# Patient Record
Sex: Female | Born: 1985 | Race: Black or African American | Hispanic: No | Marital: Single | Smoking: Current every day smoker
Health system: Southern US, Community
[De-identification: ages and names within clinical notes are randomized; demographics above are authoritative.]

## PROBLEM LIST (undated history)

## (undated) DIAGNOSIS — Z8619 Personal history of other infectious and parasitic diseases: Secondary | ICD-10-CM

## (undated) DIAGNOSIS — R51 Headache: Secondary | ICD-10-CM

## (undated) HISTORY — DX: Personal history of other infectious and parasitic diseases: Z86.19

---
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
## 2004-02-04 ENCOUNTER — Emergency Department (HOSPITAL_COMMUNITY): Admission: EM | Admit: 2004-02-04 | Discharge: 2004-02-04 | Payer: Self-pay

## 2004-07-05 ENCOUNTER — Emergency Department (HOSPITAL_COMMUNITY): Admission: EM | Admit: 2004-07-05 | Discharge: 2004-07-05 | Payer: Self-pay | Admitting: Emergency Medicine

## 2006-09-15 ENCOUNTER — Inpatient Hospital Stay (HOSPITAL_COMMUNITY): Admission: AD | Admit: 2006-09-15 | Discharge: 2006-09-15 | Payer: Self-pay | Admitting: Obstetrics

## 2006-09-21 ENCOUNTER — Ambulatory Visit (HOSPITAL_COMMUNITY): Admission: RE | Admit: 2006-09-21 | Discharge: 2006-09-21 | Payer: Self-pay | Admitting: Obstetrics

## 2006-09-21 ENCOUNTER — Encounter (INDEPENDENT_AMBULATORY_CARE_PROVIDER_SITE_OTHER): Payer: Self-pay | Admitting: *Deleted

## 2007-03-23 ENCOUNTER — Inpatient Hospital Stay (HOSPITAL_COMMUNITY): Admission: AD | Admit: 2007-03-23 | Discharge: 2007-03-26 | Payer: Self-pay | Admitting: Obstetrics

## 2007-03-23 ENCOUNTER — Encounter: Payer: Self-pay | Admitting: Emergency Medicine

## 2008-02-03 ENCOUNTER — Emergency Department (HOSPITAL_COMMUNITY): Admission: EM | Admit: 2008-02-03 | Discharge: 2008-02-03 | Payer: Self-pay | Admitting: Emergency Medicine

## 2008-09-08 ENCOUNTER — Emergency Department (HOSPITAL_COMMUNITY): Admission: EM | Admit: 2008-09-08 | Discharge: 2008-09-08 | Payer: Self-pay | Admitting: Emergency Medicine

## 2008-09-09 ENCOUNTER — Emergency Department (HOSPITAL_COMMUNITY): Admission: EM | Admit: 2008-09-09 | Discharge: 2008-09-09 | Payer: Self-pay | Admitting: Emergency Medicine

## 2008-10-19 ENCOUNTER — Emergency Department (HOSPITAL_COMMUNITY): Admission: EM | Admit: 2008-10-19 | Discharge: 2008-10-19 | Payer: Self-pay | Admitting: Emergency Medicine

## 2010-02-18 ENCOUNTER — Emergency Department (HOSPITAL_COMMUNITY): Admission: EM | Admit: 2010-02-18 | Discharge: 2010-02-18 | Payer: Self-pay | Admitting: Emergency Medicine

## 2010-09-24 ENCOUNTER — Emergency Department (HOSPITAL_COMMUNITY): Admission: EM | Admit: 2010-09-24 | Discharge: 2010-09-25 | Payer: Self-pay | Admitting: Emergency Medicine

## 2011-02-04 LAB — POCT I-STAT, CHEM 8
Calcium, Ion: 1.07 mmol/L — ABNORMAL LOW (ref 1.12–1.32)
Chloride: 105 mEq/L (ref 96–112)
Glucose, Bld: 101 mg/dL — ABNORMAL HIGH (ref 70–99)
HCT: 44 % (ref 36.0–46.0)

## 2011-02-04 LAB — URINALYSIS, ROUTINE W REFLEX MICROSCOPIC
Glucose, UA: NEGATIVE mg/dL
Ketones, ur: NEGATIVE mg/dL
Protein, ur: NEGATIVE mg/dL

## 2011-03-30 ENCOUNTER — Inpatient Hospital Stay (HOSPITAL_COMMUNITY): Payer: Medicaid Other

## 2011-03-30 ENCOUNTER — Inpatient Hospital Stay (HOSPITAL_COMMUNITY)
Admission: AD | Admit: 2011-03-30 | Discharge: 2011-03-30 | Disposition: A | Discharge status: Home / Self Care | Payer: Medicaid Other | Source: Ambulatory Visit | Attending: Obstetrics & Gynecology | Admitting: Obstetrics & Gynecology

## 2011-03-30 DIAGNOSIS — O2390 Unspecified genitourinary tract infection in pregnancy, unspecified trimester: Secondary | ICD-10-CM | POA: Insufficient documentation

## 2011-03-30 DIAGNOSIS — B3731 Acute candidiasis of vulva and vagina: Secondary | ICD-10-CM | POA: Insufficient documentation

## 2011-03-30 DIAGNOSIS — B373 Candidiasis of vulva and vagina: Secondary | ICD-10-CM

## 2011-03-30 DIAGNOSIS — R109 Unspecified abdominal pain: Secondary | ICD-10-CM

## 2011-03-30 LAB — URINE MICROSCOPIC-ADD ON

## 2011-03-30 LAB — WET PREP, GENITAL: Trich, Wet Prep: NONE SEEN

## 2011-03-30 LAB — URINALYSIS, ROUTINE W REFLEX MICROSCOPIC
Bilirubin Urine: NEGATIVE
Glucose, UA: NEGATIVE mg/dL
Hgb urine dipstick: NEGATIVE
Specific Gravity, Urine: 1.01 (ref 1.005–1.030)
Urobilinogen, UA: 1 mg/dL (ref 0.0–1.0)
pH: 6 (ref 5.0–8.0)

## 2011-03-30 LAB — ABO/RH: ABO/RH(D): O POS

## 2011-03-30 LAB — CBC
Hemoglobin: 13 g/dL (ref 12.0–15.0)
RBC: 4.56 MIL/uL (ref 3.87–5.11)
WBC: 9.7 10*3/uL (ref 4.0–10.5)

## 2011-04-11 NOTE — Op Note (Signed)
NAME:  [name], [name]                       ACCOUNT NO.:  [id number]   MEDICAL RECORD NO.:  [id number]                   PATIENT TYPE:  INP   LOCATION:  9103                                 FACILITY:  [facility]   PHYSICIAN:  Georgina Peer, M.D.              DATE OF BIRTH:  February 14, 1986   DATE OF PROCEDURE:  09/03/2003  DATE OF DISCHARGE:                                 OPERATIVE REPORT   PREOPERATIVE DIAGNOSIS:  Pregnancy at 43 and 3/7 weeks, active phase arrest.   POSTOPERATIVE DIAGNOSIS:  1. Pregnancy at 41 and 3/7 weeks, active phase arrest.  2. Chorioamnionitis.  3. Persistent occiput posterior.   PROCEDURE:  Low cervical transverse cesarean section.   SURGEON:  Georgina Peer, M.D.   ANESTHESIA:  Epidural by Dr. Quillian Quince.   ESTIMATED BLOOD LOSS:  750 mL.   FLUIDS:  1500 mL.   URINE OUTPUT:  220 mL, clear.   FINDINGS:  7 pound 5 ounce female at 14:07, foul odor to amniotic fluid,  moderate meconium stained fluid, normal tubes and ovaries.   INDICATIONS FOR PROCEDURE:  This is an 25 year old primigravida with an EDC  of August 25, 2003 who presented in active labor.  She progressed to 3 cm,  fully effaced and then to 4 cm with an edematous cervix at 10:00.  An  epidural was given and the patient was comfortable.  Pitocin augmentation  was given.  The patient still remained 4 cm and edematous.  She was taken  for a cesarean section for active phase arrest.  Shortly before the cesarean  section, the patient's temperature was noted to be 101.2.  There was no  fetal tachycardia.  There was no cough, shortness of breath or dysuria, but  a urine was sent for culture.   DESCRIPTION OF PROCEDURE:  The patient was taken to the operating room and  an epidural was dosed appropriately.  She was prepped and draped in normal  sterile fashion.  After adequate anesthesia and a reassuring fetal heart  rate tracing, a transverse skin incision was made.  The abdomen was opened  in a normal Pfannenstiel manner and a transverse bladder flap was created.  A transverse uterine incision was made.  The patient's amniotic fluid was  noted to be meconium stained and there was a foul odor to the amniotic  fluid.  The infant was in the occiput posterior presentation.  The incision  was widened.  The infant was rotated anteriorly and the head was delivered  with DeLee suction of the mouth and nose and bulb suctioning was  accomplished.  The infant was a female delivered at 14:07 with Apgars of 9 and  9 and a weight of 7 pounds 5 ounces.  Cultures anaerobic and aerobic were  taken from the placenta and the placenta was sent for pathology.  Cord blood  was taken.  The uterus contracted well.  She  received two grams of Mefoxin.  The uterine incision was closed in one layer of running locked Vicryl with  an imbricating partial layer of 0 Vicryl from the left side which was  bleeding.  There was copious irrigation of fluid and all blood and debris  were removed.  The tubes and ovaries were inspected and found to be normal.  The bladder flap was reapproximated over the incision.  All bleeders were  cauterized.  The fascia was closed with Vicryl suture.  The subcutaneous  tissue was cauterized for any bleeders.  Irrigation was accomplished.  The  skin was closed with skin staples.  The patient returned to the recovery  area in stable condition. Sponge, needle and instrument counts were correct.                                               Georgina Peer, M.D.    JPN/MEDQ  D:  09/03/2003  T:  09/03/2003  Job:  [id number]

## 2011-04-11 NOTE — Discharge Summary (Signed)
   NAME:  [name], [name]                       ACCOUNT NO.:  [id number]   MEDICAL RECORD NO.:  [id number]                   PATIENT TYPE:  INP   LOCATION:  9103                                 [facility]   PHYSICIAN:  Georgina Peer, M.D.              DATE OF BIRTH:  [birth year]   DATE OF ADMISSION:  09/03/2003  DATE OF DISCHARGE:                                 DISCHARGE SUMMARY   ADMISSION DIAGNOSIS:  Forty-one-week pregnancy in labor.   DISCHARGE DIAGNOSES:  1. Forty-one-week pregnancy.  2. Arrest of active phase.  3. Chorioamnionitis.  4. Compensated anemia.   The patient is an 25 year old primigravida with [facility] August 25, 2003 who  presented September 03, 2003 with labor.  Progressed to 4 cm and the cervix  became edematous.  The patient had a fever of 101 prior to cesarean section.  She was taken to cesarean section.  A baby persistent occiput posterior with  foul-smelling amniotic fluid was delivered.  The infant weighed 7 pounds 5  ounces, Apgars 9 and 9.  She was placed on gentamycin and clindamycin for 48  hours.  She remained afebrile.  She was ambulating, eating, and voiding,  passed gas.  Her WBC was on admission elevated to 19,800.  It peaked at  23,600 and then decreased to 18,600 on October 13.  Hemoglobin was 8.1 with  nadir at 7.8 on October 12.  She was on iron.  Her CMET was normal.  Her  staples were removed.  Her urine culture was no growth.  Her wound culture  showed gram positive cocci but no growth in two days on culture, and her  anaerobic cultures are negative so far but will be incubated for a full five  days.  She was given discharge instructions, prescription for Percocet 5/325  #50, Motrin 600 #60; she has iron over-the-counter and prenatal vitamins.  She was given a discharge booklet and she will follow up with Dr. Coral Ceo at [facility] in four to six weeks.                                               Georgina Peer,  M.D.    JPN/MEDQ  D:  09/06/2003  T:  09/06/2003  Job:  [id number]   cc:   Leonette Most A. Clearance Coots, M.D.  [address]  [place]  [place] [address]  Fax: [phone]

## 2011-04-11 NOTE — Discharge Summary (Signed)
NAMEJAZLIN, [name] NO.:  [id number]   MEDICAL RECORD NO.:  [id number]          PATIENT TYPE:  INP   LOCATION:  9317                          FACILITY:  [facility]   PHYSICIAN:  Roseanna Rainbow, M.D.DATE OF BIRTH:  1986/05/30   DATE OF ADMISSION:  03/23/2007  DATE OF DISCHARGE:  03/26/2007                               DISCHARGE SUMMARY   CHIEF COMPLAINT:  The patient is a 25 year old, G1, P1 with last  menstrual period April 24, complaining of abdominal pain.   HISTORY OF PRESENT ILLNESS:  Please see the above.  The patient  presented to the emergency department with the above complaints.  A CT  scan was negative.  Her white blood cell count was 27,000.  She was  transferred to [facility] with a likely diagnosis of pelvic  inflammatory disease.   ALLERGIES:  PENICILLIN.   PAST SURGICAL HISTORY:  Cesarean delivery.   PHYSICAL EXAMINATION:  VITAL SIGNS:  Stable, afebrile.  GENERAL:  Well-developed, well-nourished in no apparent distress.  HEENT:  Normocephalic, atraumatic.  NECK:  Supple.  LUNGS: Clear to auscultation bilaterally.  HEART:  Regular rate and rhythm.  ABDOMEN:  Bilateral lower quadrant tenderness.  PELVIC:  Bimanual exam with cervical motion tenderness.  The uterus is  tender as well.   ASSESSMENT:  Pelvic inflammatory disease.   PLAN:  Admission for broad-spectrum parenteral antibiotics.   HOSPITAL COURSE:  The patient was admitted.  Her symptoms improved.  Her  GC DNA probe was positive.  HIV was nonreactive.  Her RPR was  nonreactive as well.  She was then discharged to home on May 2.  The  patient received Depo-Provera for contraception prior to discharge.   DISCHARGE DIAGNOSIS:  Pelvic inflammatory disease.   CONDITION ON DISCHARGE:  Stable.   DIET:  Regular.   ACTIVITY:  Pelvic rest.   DISCHARGE MEDICATIONS:  1. Doxycycline.  2. Flagyl.   DISPOSITION:  The patient was to follow up in the office in 2 weeks.      Roseanna Rainbow, M.D.  Electronically Signed     LAJ/MEDQ  D:  04/30/2007  T:  04/30/2007  Job:  [id number]

## 2011-04-11 NOTE — Op Note (Signed)
[name], [name]             ACCOUNT NO.:  [id number]   MEDICAL RECORD NO.:  [id number]          PATIENT TYPE:  AMB   LOCATION:  SDC                           [facility]   PHYSICIAN:  Charles A. Clearance Coots, M.D.DATE OF BIRTH:  [birth year]   DATE OF PROCEDURE:  09/21/2006  DATE OF DISCHARGE:  09/21/2006                               OPERATIVE REPORT   PREOP DIAGNOSIS:  Missed abortion, first trimester.   POSTOP DIAGNOSIS:  Missed abortion, first trimester.   PROCEDURE:  Suction, dilation, and evacuation.   SURGEON:  Coral Ceo   ANESTHESIA:  MAC with paracervical block.   ESTIMATED BLOOD LOSS:  100 mL.   COMPLICATIONS:  None.   SPECIMEN:  Products of conception to pathology.   OPERATION:  The patient was brought to the operating room, and after  satisfactory IV sedation, the legs were brought up in stirrups, and the  vagina was prepped and draped in the usual sterile fashion.  The urinary  bladder was emptied of approximately 50 mL of clear urine.  Bimanual  examination revealed the uterus to be enlarged and mid position.  A  sterile speculum was inserted in the vaginal vault, and the cervix was  isolated.  The anterior lip of the cervix was grasped with a single-  tooth tenaculum.  Paracervical block of approximately 20 mL of 1%  Xylocaine, approximately 10 mL was injected in each lateral fornix.  The  axis of the uterus was then sounded, and the cervix was dilated to a No.  25 Pratt dilator.  A No. 8 suction cath was then easily introduced into  the uterine cavity, and all contents were evacuated.  The endometrial  surface was gritty at the conclusion of the procedure, and the uterus  contracted down quite well.  There was no active bleeding.  At the  conclusion of the procedure, all instruments were retired.  The patient  tolerated procedure well.  She was transported to the recovery room in  satisfactory condition.      Charles A. Clearance Coots, M.D.  Electronically Signed     CAH/MEDQ  D:  10/17/2006  T:  10/17/2006  Job:  [id number]

## 2011-05-11 ENCOUNTER — Emergency Department (HOSPITAL_COMMUNITY): Payer: Medicaid Other

## 2011-05-11 ENCOUNTER — Emergency Department (HOSPITAL_COMMUNITY)
Admission: EM | Admit: 2011-05-11 | Discharge: 2011-05-11 | Disposition: A | Discharge status: Home / Self Care | Payer: Medicaid Other | Attending: Emergency Medicine | Admitting: Emergency Medicine

## 2011-05-11 DIAGNOSIS — R296 Repeated falls: Secondary | ICD-10-CM | POA: Insufficient documentation

## 2011-05-11 DIAGNOSIS — S6390XA Sprain of unspecified part of unspecified wrist and hand, initial encounter: Secondary | ICD-10-CM | POA: Insufficient documentation

## 2011-05-11 DIAGNOSIS — Y9302 Activity, running: Secondary | ICD-10-CM | POA: Insufficient documentation

## 2011-05-11 DIAGNOSIS — M79609 Pain in unspecified limb: Secondary | ICD-10-CM | POA: Insufficient documentation

## 2011-05-31 LAB — RUBELLA ANTIBODY, IGM: Rubella: IMMUNE

## 2011-05-31 LAB — HEPATITIS B SURFACE ANTIGEN: Hepatitis B Surface Ag: NEGATIVE

## 2011-06-05 LAB — ABO/RH: RH Type: POSITIVE

## 2011-06-05 LAB — ANTIBODY SCREEN: Antibody Screen: NEGATIVE

## 2011-08-18 LAB — STREP A DNA PROBE: Group A Strep Probe: NEGATIVE

## 2011-08-25 LAB — URINE MICROSCOPIC-ADD ON

## 2011-08-25 LAB — URINALYSIS, ROUTINE W REFLEX MICROSCOPIC
Nitrite: POSITIVE — AB
Protein, ur: 100 — AB
Specific Gravity, Urine: 1.011
Urobilinogen, UA: 1

## 2011-08-25 LAB — POCT I-STAT, CHEM 8
BUN: 8
Creatinine, Ser: 1.2
Glucose, Bld: 96
Hemoglobin: 13.9
Potassium: 3.2 — ABNORMAL LOW
TCO2: 24

## 2011-08-25 LAB — URINE CULTURE: Colony Count: >=100000

## 2011-08-29 ENCOUNTER — Encounter (HOSPITAL_COMMUNITY): Payer: Self-pay | Admitting: *Deleted

## 2011-09-08 ENCOUNTER — Inpatient Hospital Stay (HOSPITAL_COMMUNITY)
Admission: AD | Admit: 2011-09-08 | Discharge: 2011-09-08 | Disposition: A | Discharge status: Home / Self Care | Payer: Medicaid Other | Source: Ambulatory Visit | Attending: Obstetrics | Admitting: Obstetrics

## 2011-09-08 ENCOUNTER — Encounter (HOSPITAL_COMMUNITY): Payer: Self-pay | Admitting: *Deleted

## 2011-09-08 DIAGNOSIS — O219 Vomiting of pregnancy, unspecified: Secondary | ICD-10-CM | POA: Diagnosis present

## 2011-09-08 DIAGNOSIS — O212 Late vomiting of pregnancy: Secondary | ICD-10-CM | POA: Insufficient documentation

## 2011-09-08 HISTORY — DX: Headache: R51

## 2011-09-08 LAB — URINALYSIS, ROUTINE W REFLEX MICROSCOPIC
Bilirubin Urine: NEGATIVE
Hgb urine dipstick: NEGATIVE
Ketones, ur: NEGATIVE mg/dL
Nitrite: NEGATIVE
Protein, ur: NEGATIVE mg/dL
Specific Gravity, Urine: 1.01 (ref 1.005–1.030)
Urobilinogen, UA: 0.2 mg/dL (ref 0.0–1.0)

## 2011-09-08 MED ORDER — ONDANSETRON HCL 4 MG PO TABS: 4.0000 mg | ORAL_TABLET | Freq: Every day | ORAL | Status: DC | PRN

## 2011-09-08 MED ORDER — ACETAMINOPHEN 325 MG PO TABS
650.0000 mg | ORAL_TABLET | Freq: Once | ORAL | Status: AC
  Administered 2011-09-08: 650 mg via ORAL
  Filled 2011-09-08: qty 2

## 2011-09-08 MED ORDER — GI COCKTAIL ~~LOC~~
30.0000 mL | Freq: Once | ORAL | Status: AC
  Administered 2011-09-08: 30 mL via ORAL
  Filled 2011-09-08: qty 30

## 2011-09-08 NOTE — Progress Notes (Signed)
Pt states she started having nausea and vomiting last night. Has had a sore throat and her nose is burning. No leaking, bleeding or contractions and reports good fetal movement.

## 2011-09-08 NOTE — ED Provider Notes (Signed)
History     Chief Complaint  Patient presents with  . Emesis   HPI G2P1001 at [redacted]w[redacted]d nausea and vomiting starting around 1 AM, last episode of vomiting at 6 AM, not nauseous now. C/O sore throat and nose starting after vomiting started. No fever, chills, constipation, diarrhea, no recent exposure to illness. + fetal movement, no contractions, LOF, or vaginal bleeding.  OB History    Grav Para Term Preterm Abortions TAB SAB Ect Mult Living   2 1 1       1       Past Medical History  Diagnosis Date  . Headache     Past Surgical History  Procedure Date  . Cesarean section     No family history on file.  History  Substance Use Topics  . Smoking status: Former Games developer  . Smokeless tobacco: Not on file  . Alcohol Use: No    Allergies:  Allergies  Allergen Reactions  . Penicillins     Childhood reaction    Prescriptions prior to admission  Medication Sig Dispense Refill  . prenatal vitamin w/FE, FA (PRENATAL 1 + 1) 27-1 MG TABS Take 1 tablet by mouth daily.          Review of Systems  Constitutional: Negative.   HENT: Positive for sore throat.   Respiratory: Negative.   Cardiovascular: Negative.   Gastrointestinal: Negative.   Genitourinary: Negative.   Musculoskeletal: Negative.    Physical Exam   Blood pressure 115/62, pulse 79, temperature 98.7 F (37.1 C), temperature source Oral, resp. rate 18, height 5' 6.5" (1.689 m), weight 92.171 kg (203 lb 3.2 oz), SpO2 97.00%.  Physical Exam  Constitutional: She is oriented to person, place, and time. She appears well-developed and well-nourished. No distress.  HENT:  Head: Macrocephalic.  Mouth/Throat: Uvula is midline, oropharynx is clear and moist and mucous membranes are normal. No oropharyngeal exudate, posterior oropharyngeal edema or posterior oropharyngeal erythema.       Bilateral nasal erythema  GI: Soft. There is no tenderness.  Musculoskeletal: Normal range of motion.  Neurological: She is alert and  oriented to person, place, and time.  Skin: Skin is warm and dry.  Psychiatric: She has a normal mood and affect.   EFM: 10x10 accels present, BL 140s, no decels, no contractions MAU Course  Procedures  Results for orders placed during the hospital encounter of 09/08/11 (from the past 24 hour(s))  URINALYSIS, ROUTINE W REFLEX MICROSCOPIC     Status: Normal   Collection Time   09/08/11  1:50 PM      Component Value Range   Color, Urine YELLOW  YELLOW    Appearance CLEAR  CLEAR    Specific Gravity, Urine 1.010  1.005 - 1.030    pH 6.0  5.0 - 8.0    Glucose, UA NEGATIVE  NEGATIVE (mg/dL)   Hgb urine dipstick NEGATIVE  NEGATIVE    Bilirubin Urine NEGATIVE  NEGATIVE    Ketones, ur NEGATIVE  NEGATIVE (mg/dL)   Protein, ur NEGATIVE  NEGATIVE (mg/dL)   Urobilinogen, UA 0.2  0.0 - 1.0 (mg/dL)   Nitrite NEGATIVE  NEGATIVE    Leukocytes, UA NEGATIVE  NEGATIVE    GI cocktail and tylenol for pain - throat pain improved, nose still "feels weird"  Assessment and Plan  Nausea and vomiting in pregnancy, nasal and throat irritation likely d/t vomiting Rx Zofran, may use saline nasal spray and tylenol F/U on 10/17 for regular OB visit  Mertha Clyatt 09/08/2011, 2:27 PM

## 2011-09-26 ENCOUNTER — Inpatient Hospital Stay (HOSPITAL_COMMUNITY)
Admission: AD | Admit: 2011-09-26 | Discharge: 2011-09-26 | Disposition: A | Discharge status: Home / Self Care | Payer: Medicaid Other | Source: Ambulatory Visit | Attending: Obstetrics | Admitting: Obstetrics

## 2011-09-26 ENCOUNTER — Encounter (HOSPITAL_COMMUNITY): Payer: Self-pay | Admitting: *Deleted

## 2011-09-26 DIAGNOSIS — N949 Unspecified condition associated with female genital organs and menstrual cycle: Secondary | ICD-10-CM

## 2011-09-26 DIAGNOSIS — O99891 Other specified diseases and conditions complicating pregnancy: Secondary | ICD-10-CM | POA: Insufficient documentation

## 2011-09-26 DIAGNOSIS — R109 Unspecified abdominal pain: Secondary | ICD-10-CM | POA: Insufficient documentation

## 2011-09-26 LAB — URINALYSIS, ROUTINE W REFLEX MICROSCOPIC
Bilirubin Urine: NEGATIVE
Glucose, UA: NEGATIVE mg/dL
Hgb urine dipstick: NEGATIVE
Ketones, ur: 15 mg/dL — AB
Protein, ur: 30 mg/dL — AB
Urobilinogen, UA: 1 mg/dL (ref 0.0–1.0)

## 2011-09-26 LAB — URINE MICROSCOPIC-ADD ON

## 2011-09-26 MED ORDER — HYDROCODONE-ACETAMINOPHEN 7.5-500 MG PO TABS: 1.0000 | ORAL_TABLET | Freq: Four times a day (QID) | ORAL | Status: AC | PRN

## 2011-09-26 NOTE — Progress Notes (Signed)
Lower abd pain since 10/31, pain is becoming worse.  Also has thigh pain, pain is worse with movement & changing positions.  Pt denies uc's, bleeding, or LOF.

## 2011-09-26 NOTE — Progress Notes (Signed)
Pt states she has been trying to get in touch with Dr. Gaynell Face for 2 days but unsuccessful.

## 2011-09-26 NOTE — ED Provider Notes (Signed)
History     Chief Complaint  Patient presents with  . Abdominal Pain   HPI 25 y.o. BF presents with c/o Lower abdominal pain with pain in groin. Hurts more when walks or moves legs. Hurts to sleep. No contractions or bleeding or leaking.   Past Medical History  Diagnosis Date  . Headache     Past Surgical History  Procedure Date  . Cesarean section     History reviewed. No pertinent family history.  History  Substance Use Topics  . Smoking status: Former Games developer  . Smokeless tobacco: Not on file  . Alcohol Use: No    Allergies:  Allergies  Allergen Reactions  . Penicillins     Childhood reaction    Prescriptions prior to admission  Medication Sig Dispense Refill  . prenatal vitamin w/FE, FA (PRENATAL 1 + 1) 27-1 MG TABS Take 1 tablet by mouth daily.          ROS See above Physical Exam   Blood pressure 119/62, pulse 104, temperature 98.4 F (36.9 C), temperature source Oral, resp. rate 20, height 5\' 6"  (1.676 m), weight 206 lb 12.8 oz (93.804 kg).  Physical Exam  Constitutional: She is oriented to person, place, and time. She appears well-developed and well-nourished.  HENT:  Head: Normocephalic.  Respiratory: Effort normal.  GI: Soft. She exhibits no distension and no mass. There is no tenderness. There is no rebound and no guarding.       FHR reassuring. No contractions  Genitourinary: Vagina normal and uterus normal. No vaginal discharge found.       Cervix long and closed. No CMT. Does have point tenderness over SP joint.  Musculoskeletal: Normal range of motion.  Neurological: She is alert and oriented to person, place, and time.  Skin: Skin is warm and dry.  Psychiatric: She has a normal mood and affect.    MAU Course  Procedures   Assessment and Plan  A:  Symphysis pubis dysfunction      No evidence of PTL  P:  Discussed with Dr Gaynell Face      Recommend pregnancy support belt      Will give 6 tabs of Vicodin      Work note for today only    [facility] 09/26/2011, 2:55 PM

## 2011-09-28 ENCOUNTER — Inpatient Hospital Stay (HOSPITAL_COMMUNITY)
Admission: AD | Admit: 2011-09-28 | Discharge: 2011-09-29 | Disposition: A | Discharge status: Home / Self Care | Payer: Medicaid Other | Source: Ambulatory Visit | Attending: Obstetrics | Admitting: Obstetrics

## 2011-09-28 DIAGNOSIS — Z3480 Encounter for supervision of other normal pregnancy, unspecified trimester: Secondary | ICD-10-CM

## 2011-09-28 DIAGNOSIS — J111 Influenza due to unidentified influenza virus with other respiratory manifestations: Secondary | ICD-10-CM | POA: Insufficient documentation

## 2011-09-28 DIAGNOSIS — O99891 Other specified diseases and conditions complicating pregnancy: Secondary | ICD-10-CM | POA: Insufficient documentation

## 2011-09-28 DIAGNOSIS — R6889 Other general symptoms and signs: Secondary | ICD-10-CM

## 2011-09-28 NOTE — Progress Notes (Signed)
Pt presents to [facility] for c/o sore throat, fever and chills, vomiting.  Took some tylenol cold and sinus at home.

## 2011-09-28 NOTE — Progress Notes (Signed)
M. Williams, CNM at bedside.  Assessment done and poc discussed with pt.   

## 2011-09-28 NOTE — Progress Notes (Signed)
Rapid strep and flu swab collected per cnm.

## 2011-09-28 NOTE — Progress Notes (Signed)
Pt G4 P1 at 32wks, reports sore throat, body aches, congestion, cough, and vomiting since 11/3.

## 2011-09-28 NOTE — Progress Notes (Signed)
Water provided at pt request.

## 2011-09-28 NOTE — Progress Notes (Signed)
Artelia Laroche, CNM viewed strip.  Ok to dc efm while waiting on lab results.

## 2011-09-29 LAB — RAPID STREP SCREEN (MED CTR MEBANE ONLY): Streptococcus, Group A Screen (Direct): NEGATIVE

## 2011-09-29 LAB — INFLUENZA PANEL BY PCR (TYPE A & B)
H1N1 flu by pcr: NOT DETECTED
Influenza A By PCR: NEGATIVE
Influenza B By PCR: NEGATIVE

## 2011-09-29 MED ORDER — OSELTAMIVIR PHOSPHATE 75 MG PO CAPS: 75.0000 mg | ORAL_CAPSULE | Freq: Two times a day (BID) | ORAL | Status: AC

## 2011-09-29 MED ORDER — PROMETHAZINE HCL 25 MG PO TABS: 25.0000 mg | ORAL_TABLET | Freq: Four times a day (QID) | ORAL | Status: AC | PRN

## 2011-09-29 NOTE — ED Provider Notes (Signed)
History     Chief Complaint  Patient presents with  . Generalized Body Aches  . Sore Throat  . Emesis   HPI 25 y.o. G4 P1021 at [redacted]w[redacted]d presents with c/o fever, chills, sore throat and malaise today. Started with sore throat yesterday and had one episode of vomiting today. No dyspnea. Came in to see if baby was ok.    Past Medical History  Diagnosis Date  . Headache     Past Surgical History  Procedure Date  . Cesarean section     No family history on file.  History  Substance Use Topics  . Smoking status: Former Games developer  . Smokeless tobacco: Not on file  . Alcohol Use: No    Allergies:  Allergies  Allergen Reactions  . Penicillins Other (See Comments)    Childhood allergy; reaction unknown    Prescriptions prior to admission  Medication Sig Dispense Refill  . Acetaminophen (TYLENOL) 167 MG/5ML LIQD Take 10 mLs by mouth daily as needed. For sore throat       . HYDROcodone-acetaminophen (LORTAB) 7.5-500 MG per tablet Take 1 tablet by mouth every 6 (six) hours as needed for pain.  6 tablet  0  . prenatal vitamin w/FE, FA (PRENATAL 1 + 1) 27-1 MG TABS Take 1 tablet by mouth daily.          Review of Systems  Constitutional: Positive for fever, chills and malaise/fatigue.  HENT: Positive for sore throat.   Gastrointestinal: Positive for nausea and vomiting.  Neurological: Positive for weakness.   Physical Exam   Blood pressure 130/64, pulse 102, temperature 101 F (38.3 C), temperature source Oral, resp. rate 20, height 5\' 6"  (1.676 m), weight 208 lb 3.2 oz (94.439 kg).  Physical Exam  Constitutional: She is oriented to person, place, and time. She appears well-developed and well-nourished. No distress.  HENT:  Head: Normocephalic.  Mouth/Throat: Oropharynx is clear and moist. No oropharyngeal exudate.  Eyes: Right eye exhibits no discharge.  Neck: Normal range of motion.  Cardiovascular: Normal rate, regular rhythm and normal heart sounds.   Respiratory:  Effort normal and breath sounds normal. No stridor. No respiratory distress. She has no wheezes. She has no rales. She exhibits no tenderness.  GI: Soft. She exhibits no distension. There is no tenderness. There is no rebound and no guarding.  Musculoskeletal: Normal range of motion.  Neurological: She is alert and oriented to person, place, and time.  Skin: Skin is warm and dry. She is not diaphoretic.  Psychiatric: She has a normal mood and affect.   FHR reactive with no contractions Rapid Strep Negative MAU Course  Procedures   Assessment and Plan  A:  Influenza-like illness      Pregnancy, reassuring fetal status  P:  Consulted Dr Tamela Oddi      Rapid Flu pending      Will treat presumptively for flu with Tamiflu per Dr Georgeanna Lea diet, supportive care   [facility] 09/29/2011, 12:10 AM

## 2011-10-20 LAB — STREP B DNA PROBE: GBS: POSITIVE

## 2011-11-11 ENCOUNTER — Telehealth (HOSPITAL_COMMUNITY): Payer: Self-pay | Admitting: *Deleted

## 2011-11-11 ENCOUNTER — Encounter (HOSPITAL_COMMUNITY): Payer: Self-pay | Admitting: *Deleted

## 2011-11-11 NOTE — Telephone Encounter (Signed)
Preadmission screen  

## 2011-11-20 ENCOUNTER — Inpatient Hospital Stay (HOSPITAL_COMMUNITY)
Admission: AD | Admit: 2011-11-20 | Discharge: 2011-11-20 | Disposition: A | Discharge status: Home / Self Care | Payer: Medicaid Other | Source: Ambulatory Visit | Attending: Obstetrics | Admitting: Obstetrics

## 2011-11-20 ENCOUNTER — Encounter (HOSPITAL_COMMUNITY): Payer: Self-pay

## 2011-11-20 DIAGNOSIS — Z3689 Encounter for other specified antenatal screening: Secondary | ICD-10-CM

## 2011-11-20 DIAGNOSIS — Z36 Encounter for antenatal screening of mother: Secondary | ICD-10-CM

## 2011-11-20 DIAGNOSIS — O99891 Other specified diseases and conditions complicating pregnancy: Secondary | ICD-10-CM | POA: Insufficient documentation

## 2011-11-20 NOTE — Progress Notes (Signed)
Patient states she just found out her niece's baby had died and wants to make sure her baby is OK. Reports good fetal movement, no bleeding or leaking and no contractions. Is just concerned.

## 2011-11-20 NOTE — ED Provider Notes (Signed)
History     No chief complaint on file.  HPI  Pt was here when niece learned of an IUFD, wants to make sure baby is okay.  Denies any problems.    Past Medical History  Diagnosis Date  . Headache   . History of chlamydia     Past Surgical History  Procedure Date  . Cesarean section     Family History  Problem Relation Age of Onset  . Hypertension Mother   . Alcohol abuse Mother   . Diabetes Father   . Hypertension Father   . Alcohol abuse Father     History  Substance Use Topics  . Smoking status: Former Games developer  . Smokeless tobacco: Not on file  . Alcohol Use: No    Allergies:  Allergies  Allergen Reactions  . Penicillins Other (See Comments)    Childhood allergy; reaction unknown    Prescriptions prior to admission  Medication Sig Dispense Refill  . Prenatal Vit-Fe Fumarate-FA (PRENATAL MULTIVITAMIN) TABS Take 1 tablet by mouth daily.          ROS Physical Exam   Blood pressure 158/72, pulse 119, temperature 99.5 F (37.5 C), temperature source Oral, resp. rate 20, height 5\' 5"  (1.651 m), weight 96.888 kg (213 lb 9.6 oz), SpO2 97.00%. Repeat blood pressure 112/61 Pulse 83  Physical Exam  Constitutional: She is oriented to person, place, and time. She appears well-developed and well-nourished.  HENT:  Head: Normocephalic.  Neck: Normal range of motion. Neck supple.  Cardiovascular: Normal rate, regular rhythm and normal heart sounds.   Respiratory: Effort normal and breath sounds normal.  Genitourinary: No bleeding around the vagina.  Neurological: She is alert and oriented to person, place, and time.  Skin: Skin is warm and dry.    MAU Course  Procedures  FHR 120's, +accels Toco - irregular Consult with Dr. Gaynell Face > ok with discharge home with follow-up on 11/24/11  Assessment and Plan  Reactive NST  Plan: DC to home Follow-up on Monday  [facility] 11/20/2011, 1:53 PM

## 2011-11-21 ENCOUNTER — Inpatient Hospital Stay (HOSPITAL_COMMUNITY)
Admission: AD | Admit: 2011-11-21 | Discharge: 2011-11-21 | Disposition: A | Discharge status: Home / Self Care | Payer: Medicaid Other | Source: Ambulatory Visit | Attending: Obstetrics | Admitting: Obstetrics

## 2011-11-21 ENCOUNTER — Encounter (HOSPITAL_COMMUNITY): Payer: Self-pay

## 2011-11-21 DIAGNOSIS — O479 False labor, unspecified: Secondary | ICD-10-CM | POA: Insufficient documentation

## 2011-11-21 NOTE — Progress Notes (Signed)
Patient reports intermittent back pain that wraps around her abdomen since 12noon today. Doesn't know how frequent it is. Denies vaginal bleeding or leaking of fluid. Reports positive fetal movement. States Dr. Gaynell Face told her she would be induced on Monday.

## 2011-11-26 ENCOUNTER — Encounter (HOSPITAL_COMMUNITY): Payer: Self-pay

## 2011-11-26 ENCOUNTER — Inpatient Hospital Stay (HOSPITAL_COMMUNITY)
Admission: RE | Admit: 2011-11-26 | Discharge: 2011-11-29 | DRG: 766 | Disposition: A | Discharge status: Home / Self Care | Payer: Medicaid Other | Source: Ambulatory Visit | Attending: Obstetrics | Admitting: Obstetrics

## 2011-11-26 VITALS — BP 110/66 | HR 93 | Temp 98.6°F | Resp 18 | Ht 66.0 in | Wt 223.0 lb

## 2011-11-26 DIAGNOSIS — Z22330 Carrier of Group B streptococcus: Secondary | ICD-10-CM

## 2011-11-26 DIAGNOSIS — O219 Vomiting of pregnancy, unspecified: Secondary | ICD-10-CM

## 2011-11-26 DIAGNOSIS — O99892 Other specified diseases and conditions complicating childbirth: Secondary | ICD-10-CM | POA: Diagnosis present

## 2011-11-26 DIAGNOSIS — O34219 Maternal care for unspecified type scar from previous cesarean delivery: Principal | ICD-10-CM | POA: Diagnosis present

## 2011-11-26 LAB — CBC
MCH: 27.4 pg (ref 26.0–34.0)
MCV: 83.4 fL (ref 78.0–100.0)
Platelets: 233 10*3/uL (ref 150–400)
RDW: 15.2 % (ref 11.5–15.5)
WBC: 11.7 10*3/uL — ABNORMAL HIGH (ref 4.0–10.5)

## 2011-11-26 MED ORDER — CLINDAMYCIN PHOSPHATE 900 MG/50ML IV SOLN
900.0000 mg | Freq: Three times a day (TID) | INTRAVENOUS | Status: DC
  Administered 2011-11-26: 900 mg via INTRAVENOUS
  Filled 2011-11-26 (×3): qty 50

## 2011-11-26 MED ORDER — OXYTOCIN 20 UNITS IN LACTATED RINGERS INFUSION - SIMPLE: 125.0000 mL/h | Freq: Once | INTRAVENOUS | Status: DC

## 2011-11-26 MED ORDER — ACETAMINOPHEN 325 MG PO TABS
650.0000 mg | ORAL_TABLET | ORAL | Status: DC | PRN
  Administered 2011-11-27: 650 mg via ORAL
  Filled 2011-11-26: qty 2

## 2011-11-26 MED ORDER — DIPHENHYDRAMINE HCL 50 MG/ML IJ SOLN
12.5000 mg | INTRAMUSCULAR | Status: DC | PRN
Start: 2011-11-26 — End: 2011-11-27

## 2011-11-26 MED ORDER — TERBUTALINE SULFATE 1 MG/ML IJ SOLN: 0.2500 mg | Freq: Once | INTRAMUSCULAR | Status: AC | PRN

## 2011-11-26 MED ORDER — BUTORPHANOL TARTRATE 2 MG/ML IJ SOLN
1.0000 mg | INTRAMUSCULAR | Status: DC | PRN
  Administered 2011-11-26 (×3): 1 mg via INTRAVENOUS
  Filled 2011-11-26 (×3): qty 1

## 2011-11-26 MED ORDER — LACTATED RINGERS IV SOLN: 500.0000 mL | Freq: Once | INTRAVENOUS | Status: DC

## 2011-11-26 MED ORDER — FENTANYL 2.5 MCG/ML BUPIVACAINE 1/10 % EPIDURAL INFUSION (WH - ANES)
14.0000 mL/h | INTRAMUSCULAR | Status: DC
  Administered 2011-11-27: 14 mL/h via EPIDURAL
  Filled 2011-11-26 (×2): qty 60

## 2011-11-26 MED ORDER — EPHEDRINE 5 MG/ML INJ: 10.0000 mg | INTRAVENOUS | Status: DC | PRN

## 2011-11-26 MED ORDER — OXYTOCIN BOLUS FROM INFUSION
500.0000 mL | Freq: Once | INTRAVENOUS | Status: DC
  Filled 2011-11-26: qty 500

## 2011-11-26 MED ORDER — SODIUM BICARBONATE 8.4 % IV SOLN
INTRAVENOUS | Status: DC | PRN
  Administered 2011-11-26: 4 mL via EPIDURAL

## 2011-11-26 MED ORDER — LACTATED RINGERS IV SOLN
INTRAVENOUS | Status: DC
  Administered 2011-11-26: 500 mL via INTRAVENOUS
  Administered 2011-11-26 – 2011-11-27 (×6): via INTRAVENOUS

## 2011-11-26 MED ORDER — OXYTOCIN 20 UNITS IN LACTATED RINGERS INFUSION - SIMPLE
1.0000 m[IU]/min | INTRAVENOUS | Status: DC
  Administered 2011-11-26: 21 m[IU]/min via INTRAVENOUS
  Administered 2011-11-26: 1 m[IU]/min via INTRAVENOUS
  Filled 2011-11-26: qty 1000

## 2011-11-26 MED ORDER — EPHEDRINE 5 MG/ML INJ
10.0000 mg | INTRAVENOUS | Status: DC | PRN
  Filled 2011-11-26: qty 4

## 2011-11-26 MED ORDER — PHENYLEPHRINE 40 MCG/ML (10ML) SYRINGE FOR IV PUSH (FOR BLOOD PRESSURE SUPPORT)
80.0000 ug | PREFILLED_SYRINGE | INTRAVENOUS | Status: DC | PRN
  Filled 2011-11-26: qty 5

## 2011-11-26 MED ORDER — OXYCODONE-ACETAMINOPHEN 5-325 MG PO TABS: 2.0000 | ORAL_TABLET | ORAL | Status: DC | PRN

## 2011-11-26 MED ORDER — FENTANYL 2.5 MCG/ML BUPIVACAINE 1/10 % EPIDURAL INFUSION (WH - ANES)
INTRAMUSCULAR | Status: DC | PRN
  Administered 2011-11-26: 14 mL/h via EPIDURAL

## 2011-11-26 MED ORDER — FLEET ENEMA 7-19 GM/118ML RE ENEM: 1.0000 | ENEMA | RECTAL | Status: DC | PRN

## 2011-11-26 MED ORDER — CITRIC ACID-SODIUM CITRATE 334-500 MG/5ML PO SOLN
30.0000 mL | ORAL | Status: DC | PRN
  Administered 2011-11-27: 30 mL via ORAL
  Filled 2011-11-26: qty 15

## 2011-11-26 MED ORDER — IBUPROFEN 600 MG PO TABS: 600.0000 mg | ORAL_TABLET | Freq: Four times a day (QID) | ORAL | Status: DC | PRN

## 2011-11-26 MED ORDER — PHENYLEPHRINE 40 MCG/ML (10ML) SYRINGE FOR IV PUSH (FOR BLOOD PRESSURE SUPPORT): 80.0000 ug | PREFILLED_SYRINGE | INTRAVENOUS | Status: DC | PRN

## 2011-11-26 MED ORDER — ONDANSETRON HCL 4 MG/2ML IJ SOLN: 4.0000 mg | Freq: Four times a day (QID) | INTRAMUSCULAR | Status: DC | PRN

## 2011-11-26 MED ORDER — CLINDAMYCIN PHOSPHATE 900 MG/50ML IV SOLN
900.0000 mg | Freq: Three times a day (TID) | INTRAVENOUS | Status: DC
  Administered 2011-11-26 – 2011-11-27 (×2): 900 mg via INTRAVENOUS
  Filled 2011-11-26 (×3): qty 50

## 2011-11-26 MED ORDER — LIDOCAINE HCL (PF) 1 % IJ SOLN: 30.0000 mL | INTRAMUSCULAR | Status: DC | PRN

## 2011-11-26 MED ORDER — LACTATED RINGERS IV SOLN: 500.0000 mL | INTRAVENOUS | Status: DC | PRN

## 2011-11-26 NOTE — H&P (Signed)
This is Dr. Francoise Ceo dictating the history history and physical on blank blank she's a 26 year old gravida 4 para 1021 at 40 weeks and 3 days her due date is 11/23/2011 she positive GBS and she's had a previous C-section this she's in for induction of labor at term cervix is 1 cm 80% vertex -2-3 past medical history  Past surgical history she had a C-section in the past Past medical history negative This social history negative System review negative Physical exam HEENT negative Breasts negative Heart regular and no murmurs no gallops Abdomen term Cervix is as described above Extremities negative

## 2011-11-26 NOTE — Anesthesia Preprocedure Evaluation (Signed)
Anesthesia Evaluation    Airway Mallampati: III      Dental   Pulmonary          Cardiovascular     Neuro/Psych    GI/Hepatic   Endo/Other  Morbid obesity  Renal/GU      Musculoskeletal   Abdominal   Peds  Hematology   Anesthesia Other Findings   Reproductive/Obstetrics                           Anesthesia Physical Anesthesia Plan  ASA: III  Anesthesia Plan: Epidural   Post-op Pain Management:    Induction:   Airway Management Planned:   Additional Equipment:   Intra-op Plan:   Post-operative Plan:   Informed Consent: I have reviewed the patients History and Physical, chart, labs and discussed the procedure including the risks, benefits and alternatives for the proposed anesthesia with the patient or authorized representative who has indicated his/her understanding and acceptance.   Dental Advisory Given  Plan Discussed with:   Anesthesia Plan Comments: (Labs checked- platelets confirmed with RN in room. Fetal heart tracing, per RN, reported to be stable enough for sitting procedure. Discussed epidural, and patient consents to the procedure:  included risk of possible headache,backache, failed block, allergic reaction, and nerve injury. This patient was asked if she had any questions or concerns before the procedure started. )        Anesthesia Quick Evaluation

## 2011-11-26 NOTE — Anesthesia Procedure Notes (Signed)

## 2011-11-26 NOTE — Progress Notes (Signed)
Patient ID: [name], female   DOB: [birth year], 26 y.o.   MRN: [id number] Leaking clear fluid from amniotomy and a small and and cervix is now 2 cm 80% with the vertex at -3 station she's a 50 milliunits of Pitocin an IUPC was inserted and and

## 2011-11-27 ENCOUNTER — Encounter (HOSPITAL_COMMUNITY): Payer: Self-pay | Admitting: Anesthesiology

## 2011-11-27 ENCOUNTER — Inpatient Hospital Stay (HOSPITAL_COMMUNITY): Payer: Medicaid Other | Admitting: Anesthesiology

## 2011-11-27 ENCOUNTER — Encounter (HOSPITAL_COMMUNITY)
Admission: RE | Disposition: A | Discharge status: Home / Self Care | Payer: Self-pay | Source: Ambulatory Visit | Attending: Obstetrics

## 2011-11-27 ENCOUNTER — Encounter (HOSPITAL_COMMUNITY): Payer: Self-pay

## 2011-11-27 SURGERY — Surgical Case
Anesthesia: Epidural | Site: Abdomen | Wound class: Clean Contaminated

## 2011-11-27 MED ORDER — DIPHENHYDRAMINE HCL 50 MG/ML IJ SOLN: 12.5000 mg | INTRAMUSCULAR | Status: DC | PRN

## 2011-11-27 MED ORDER — ONDANSETRON HCL 4 MG PO TABS: 4.0000 mg | ORAL_TABLET | ORAL | Status: DC | PRN

## 2011-11-27 MED ORDER — DIBUCAINE 1 % RE OINT: 1.0000 "application " | TOPICAL_OINTMENT | RECTAL | Status: DC | PRN

## 2011-11-27 MED ORDER — INFLUENZA VIRUS VACC SPLIT PF IM SUSP
0.5000 mL | INTRAMUSCULAR | Status: AC
  Administered 2011-11-28: 0.5 mL via INTRAMUSCULAR
  Filled 2011-11-27: qty 0.5

## 2011-11-27 MED ORDER — OXYTOCIN 10 UNIT/ML IJ SOLN
INTRAMUSCULAR | Status: AC
  Filled 2011-11-27: qty 4

## 2011-11-27 MED ORDER — SODIUM CHLORIDE 0.9 % IJ SOLN: 3.0000 mL | INTRAMUSCULAR | Status: DC | PRN

## 2011-11-27 MED ORDER — MEPERIDINE HCL 25 MG/ML IJ SOLN: 6.2500 mg | INTRAMUSCULAR | Status: DC | PRN

## 2011-11-27 MED ORDER — CHLOROPROCAINE HCL 3 % IJ SOLN
INTRAMUSCULAR | Status: AC
  Filled 2011-11-27: qty 20

## 2011-11-27 MED ORDER — OXYTOCIN 20 UNITS IN LACTATED RINGERS INFUSION - SIMPLE
INTRAVENOUS | Status: DC | PRN
  Administered 2011-11-27 (×2): 20 [IU] via INTRAVENOUS

## 2011-11-27 MED ORDER — ONDANSETRON HCL 4 MG/2ML IJ SOLN
INTRAMUSCULAR | Status: AC
  Filled 2011-11-27: qty 2

## 2011-11-27 MED ORDER — MORPHINE SULFATE (PF) 0.5 MG/ML IJ SOLN
INTRAMUSCULAR | Status: DC | PRN
  Administered 2011-11-27: 1 mg via INTRAVENOUS

## 2011-11-27 MED ORDER — OXYTOCIN 20 UNITS IN LACTATED RINGERS INFUSION - SIMPLE: 125.0000 mL/h | INTRAVENOUS | Status: AC

## 2011-11-27 MED ORDER — LIDOCAINE-EPINEPHRINE (PF) 2 %-1:200000 IJ SOLN
INTRAMUSCULAR | Status: AC
  Filled 2011-11-27: qty 20

## 2011-11-27 MED ORDER — ONDANSETRON HCL 4 MG/2ML IJ SOLN
4.0000 mg | Freq: Three times a day (TID) | INTRAMUSCULAR | Status: DC | PRN
  Filled 2011-11-27: qty 2

## 2011-11-27 MED ORDER — HYDROMORPHONE HCL PF 1 MG/ML IJ SOLN
0.2500 mg | INTRAMUSCULAR | Status: DC | PRN
  Administered 2011-11-27 (×2): 0.5 mg via INTRAVENOUS

## 2011-11-27 MED ORDER — FENTANYL CITRATE 0.05 MG/ML IJ SOLN
INTRAMUSCULAR | Status: AC
  Filled 2011-11-27: qty 5

## 2011-11-27 MED ORDER — MORPHINE SULFATE (PF) 0.5 MG/ML IJ SOLN
INTRAMUSCULAR | Status: DC | PRN
  Administered 2011-11-27: 4 mg via EPIDURAL

## 2011-11-27 MED ORDER — SENNOSIDES-DOCUSATE SODIUM 8.6-50 MG PO TABS
2.0000 | ORAL_TABLET | Freq: Every day | ORAL | Status: DC
  Administered 2011-11-27 – 2011-11-28 (×2): 2 via ORAL

## 2011-11-27 MED ORDER — SCOPOLAMINE 1 MG/3DAYS TD PT72
MEDICATED_PATCH | TRANSDERMAL | Status: AC
  Filled 2011-11-27: qty 1

## 2011-11-27 MED ORDER — KETOROLAC TROMETHAMINE 60 MG/2ML IM SOLN
60.0000 mg | Freq: Once | INTRAMUSCULAR | Status: AC | PRN
  Administered 2011-11-27: 60 mg via INTRAMUSCULAR

## 2011-11-27 MED ORDER — KETOROLAC TROMETHAMINE 30 MG/ML IJ SOLN
30.0000 mg | Freq: Four times a day (QID) | INTRAMUSCULAR | Status: AC | PRN
  Administered 2011-11-27: 30 mg via INTRAVENOUS
  Filled 2011-11-27: qty 1

## 2011-11-27 MED ORDER — NALBUPHINE HCL 10 MG/ML IJ SOLN
5.0000 mg | INTRAMUSCULAR | Status: DC | PRN
  Filled 2011-11-27: qty 1

## 2011-11-27 MED ORDER — IBUPROFEN 600 MG PO TABS
600.0000 mg | ORAL_TABLET | Freq: Four times a day (QID) | ORAL | Status: DC | PRN
  Administered 2011-11-28: 600 mg via ORAL
  Filled 2011-11-27 (×5): qty 1

## 2011-11-27 MED ORDER — DIPHENHYDRAMINE HCL 25 MG PO CAPS
25.0000 mg | ORAL_CAPSULE | ORAL | Status: DC | PRN
  Filled 2011-11-27: qty 1

## 2011-11-27 MED ORDER — KETOROLAC TROMETHAMINE 60 MG/2ML IM SOLN
INTRAMUSCULAR | Status: AC
  Administered 2011-11-27: 60 mg via INTRAMUSCULAR
  Filled 2011-11-27: qty 2

## 2011-11-27 MED ORDER — ZOLPIDEM TARTRATE 5 MG PO TABS: 5.0000 mg | ORAL_TABLET | Freq: Every evening | ORAL | Status: DC | PRN

## 2011-11-27 MED ORDER — CEFAZOLIN SODIUM 1-5 GM-% IV SOLN: 1.0000 g | INTRAVENOUS | Status: DC

## 2011-11-27 MED ORDER — FENTANYL CITRATE 0.05 MG/ML IJ SOLN
INTRAMUSCULAR | Status: DC | PRN
  Administered 2011-11-27 (×2): 100 ug via INTRAVENOUS
  Administered 2011-11-27: 50 ug via INTRAVENOUS

## 2011-11-27 MED ORDER — MORPHINE SULFATE 0.5 MG/ML IJ SOLN
INTRAMUSCULAR | Status: AC
  Filled 2011-11-27: qty 10

## 2011-11-27 MED ORDER — KETOROLAC TROMETHAMINE 30 MG/ML IJ SOLN: 30.0000 mg | Freq: Four times a day (QID) | INTRAMUSCULAR | Status: AC | PRN

## 2011-11-27 MED ORDER — SIMETHICONE 80 MG PO CHEW
80.0000 mg | CHEWABLE_TABLET | Freq: Three times a day (TID) | ORAL | Status: DC
  Administered 2011-11-27 – 2011-11-29 (×6): 80 mg via ORAL

## 2011-11-27 MED ORDER — DIPHENHYDRAMINE HCL 50 MG/ML IJ SOLN: 25.0000 mg | INTRAMUSCULAR | Status: DC | PRN

## 2011-11-27 MED ORDER — DIPHENHYDRAMINE HCL 25 MG PO CAPS: 25.0000 mg | ORAL_CAPSULE | Freq: Four times a day (QID) | ORAL | Status: DC | PRN

## 2011-11-27 MED ORDER — SODIUM BICARBONATE 8.4 % IV SOLN
INTRAVENOUS | Status: AC
  Filled 2011-11-27: qty 50

## 2011-11-27 MED ORDER — NALOXONE HCL 0.4 MG/ML IJ SOLN: 0.4000 mg | INTRAMUSCULAR | Status: DC | PRN

## 2011-11-27 MED ORDER — IBUPROFEN 600 MG PO TABS
600.0000 mg | ORAL_TABLET | Freq: Four times a day (QID) | ORAL | Status: DC
  Administered 2011-11-27 – 2011-11-29 (×6): 600 mg via ORAL
  Filled 2011-11-27 (×2): qty 1

## 2011-11-27 MED ORDER — PRENATAL MULTIVITAMIN CH
1.0000 | ORAL_TABLET | Freq: Every day | ORAL | Status: DC
  Administered 2011-11-28 – 2011-11-29 (×2): 1 via ORAL
  Filled 2011-11-27 (×2): qty 1

## 2011-11-27 MED ORDER — SODIUM CHLORIDE 0.9 % IV SOLN
1.0000 ug/kg/h | INTRAVENOUS | Status: DC | PRN
  Filled 2011-11-27: qty 2.5

## 2011-11-27 MED ORDER — METOCLOPRAMIDE HCL 5 MG/ML IJ SOLN
10.0000 mg | Freq: Three times a day (TID) | INTRAMUSCULAR | Status: DC | PRN
  Administered 2011-11-27: 10 mg via INTRAVENOUS
  Filled 2011-11-27: qty 2

## 2011-11-27 MED ORDER — TETANUS-DIPHTH-ACELL PERTUSSIS 5-2.5-18.5 LF-MCG/0.5 IM SUSP
0.5000 mL | Freq: Once | INTRAMUSCULAR | Status: AC
  Administered 2011-11-28: 0.5 mL via INTRAMUSCULAR

## 2011-11-27 MED ORDER — HYDROMORPHONE HCL PF 1 MG/ML IJ SOLN
INTRAMUSCULAR | Status: AC
  Filled 2011-11-27: qty 1

## 2011-11-27 MED ORDER — OXYCODONE-ACETAMINOPHEN 5-325 MG PO TABS
1.0000 | ORAL_TABLET | ORAL | Status: DC | PRN
  Administered 2011-11-28 – 2011-11-29 (×5): 1 via ORAL
  Filled 2011-11-27 (×3): qty 1
  Filled 2011-11-27: qty 2
  Filled 2011-11-27: qty 1

## 2011-11-27 MED ORDER — SCOPOLAMINE 1 MG/3DAYS TD PT72
1.0000 | MEDICATED_PATCH | Freq: Once | TRANSDERMAL | Status: DC
  Administered 2011-11-27: 1.5 mg via TRANSDERMAL

## 2011-11-27 MED ORDER — ONDANSETRON HCL 4 MG/2ML IJ SOLN
4.0000 mg | INTRAMUSCULAR | Status: DC | PRN
  Administered 2011-11-27: 4 mg via INTRAVENOUS

## 2011-11-27 MED ORDER — LANOLIN HYDROUS EX OINT: 1.0000 "application " | TOPICAL_OINTMENT | CUTANEOUS | Status: DC | PRN

## 2011-11-27 MED ORDER — SIMETHICONE 80 MG PO CHEW: 80.0000 mg | CHEWABLE_TABLET | ORAL | Status: DC | PRN

## 2011-11-27 MED ORDER — ONDANSETRON HCL 4 MG/2ML IJ SOLN
INTRAMUSCULAR | Status: DC | PRN
  Administered 2011-11-27: 4 mg via INTRAVENOUS

## 2011-11-27 MED ORDER — MENTHOL 3 MG MT LOZG: 1.0000 | LOZENGE | OROMUCOSAL | Status: DC | PRN

## 2011-11-27 MED ORDER — WITCH HAZEL-GLYCERIN EX PADS: 1.0000 "application " | MEDICATED_PAD | CUTANEOUS | Status: DC | PRN

## 2011-11-27 MED ORDER — LACTATED RINGERS IV SOLN
INTRAVENOUS | Status: DC
  Administered 2011-11-27: 13:00:00 via INTRAVENOUS

## 2011-11-27 SURGICAL SUPPLY — 28 items
CHLORAPREP W/TINT 26ML (MISCELLANEOUS) IMPLANT
CLOTH BEACON ORANGE TIMEOUT ST (SAFETY) ×2 IMPLANT
DERMABOND ADVANCED (GAUZE/BANDAGES/DRESSINGS) ×1
DERMABOND ADVANCED .7 DNX12 (GAUZE/BANDAGES/DRESSINGS) ×1 IMPLANT
DRSG COVADERM 4X8 (GAUZE/BANDAGES/DRESSINGS) ×2 IMPLANT
ELECT REM PT RETURN 9FT ADLT (ELECTROSURGICAL) ×2
ELECTRODE REM PT RTRN 9FT ADLT (ELECTROSURGICAL) ×1 IMPLANT
EXTRACTOR VACUUM M CUP 4 TUBE (SUCTIONS) IMPLANT
GLOVE BIO SURGEON STRL SZ8.5 (GLOVE) ×4 IMPLANT
GOWN PREVENTION PLUS LG XLONG (DISPOSABLE) ×2 IMPLANT
GOWN PREVENTION PLUS XXLARGE (GOWN DISPOSABLE) ×2 IMPLANT
KIT ABG SYR 3ML LUER SLIP (SYRINGE) IMPLANT
NEEDLE HYPO 25X5/8 SAFETYGLIDE (NEEDLE) IMPLANT
NS IRRIG 1000ML POUR BTL (IV SOLUTION) ×2 IMPLANT
PACK C SECTION WH (CUSTOM PROCEDURE TRAY) ×2 IMPLANT
SLEEVE SCD COMPRESS KNEE MED (MISCELLANEOUS) ×4 IMPLANT
SUT CHROMIC 0 CT 802H (SUTURE) ×2 IMPLANT
SUT CHROMIC 1 CTX 36 (SUTURE) ×4 IMPLANT
SUT CHROMIC 2 0 SH (SUTURE) ×2 IMPLANT
SUT GUT PLAIN 0 CT-3 TAN 27 (SUTURE) IMPLANT
SUT MON AB 4-0 PS1 27 (SUTURE) ×2 IMPLANT
SUT VIC AB 0 CT1 18XCR BRD8 (SUTURE) IMPLANT
SUT VIC AB 0 CT1 8-18 (SUTURE)
SUT VIC AB 0 CTX 36 (SUTURE) ×1
SUT VIC AB 0 CTX36XBRD ANBCTRL (SUTURE) ×1 IMPLANT
TOWEL OR 17X24 6PK STRL BLUE (TOWEL DISPOSABLE) ×4 IMPLANT
TRAY FOLEY CATH 14FR (SET/KITS/TRAYS/PACK) IMPLANT
WATER STERILE IRR 1000ML POUR (IV SOLUTION) ×2 IMPLANT

## 2011-11-27 NOTE — Progress Notes (Signed)
Patient ID: [name], female   DOB: [birth year], 26 y.o.   MRN: [id number] Cervix is now 2-3 cm 90% vertex -2-3 station the patient does not made in the significant progress since admission and induction so she delivered by repeat C-section postdates for failure to progress in labor and

## 2011-11-27 NOTE — Anesthesia Postprocedure Evaluation (Signed)
Anesthesia Post Note  Patient: [name]  Procedure(s) Performed:  CESAREAN SECTION  Anesthesia type: Spinal  Patient location: PACU  Post pain: Pain level controlled  Post assessment: Post-op Vital signs reviewed  Last Vitals:  Filed Vitals:   11/27/11 1400  BP: 102/63  Pulse: 67  Temp: 36.5 C  Resp: 18    Post vital signs: Reviewed  Level of consciousness: awake  Complications: No apparent anesthesia complications

## 2011-11-27 NOTE — Anesthesia Postprocedure Evaluation (Signed)
  Anesthesia Post-op Note  Patient: [name]  Procedure(s) Performed:  CESAREAN SECTION  Patient Location: 132  Anesthesia Type: Epidural  Level of Consciousness: awake, alert  and oriented  Airway and Oxygen Therapy: Patient Spontanous Breathing  Post-op Pain: mild  Post-op Assessment: Post-op Vital signs reviewed, Patient's Cardiovascular Status Stable, No headache, No backache, No residual numbness and No residual motor weakness  Post-op Vital Signs: Reviewed and stable  Complications: No apparent anesthesia complications

## 2011-11-27 NOTE — Progress Notes (Signed)
Dr. Gaynell Face called to check on patient status.  Advised of last SVE and current pitocin dose.  Order received to begin steps for c-section.

## 2011-11-27 NOTE — Op Note (Signed)
preop diagnosis failed induction previous cesarean section at term Postop diagnosis same Surgeon Dr. Francoise Ceo Anesthesia epidural Procedure patient placed on the operative table in the supine position abdomen prepped and draped bladder and to the Foley catheter a transverse suprapubic incision made carried down to the rectus fascia fascia cleaned and assessment of the incision recti muscles retracted laterally peritoneum incised longitudinally transverse incision made on the visceroperitoneum above the above the bladder and a transverse low uterine incision made fluid clear patient delivered from the OP position of a female Apgar 9 and 9 weighing 7 lbs. 8 oz. The team was in attendance the placenta was posterior mold manually and sent to labor and delivery uterine cavity clean with dry laps uterine incision closed in one layer with continuous within normal on chromic hemostasis was satisfactory bladder flap reattached to a chromic uterus well contracted tubes and ovaries normal abdomen chosen as peritoneum continuous with 2-0 chromic fascia continuous with of 0 Dexon and the skin shows a subcuticular stitch stitch of 4-0 Monocryl blood loss was 400 cc patient tolerated the procedure well taken to recovery room in good condition end of dictation dictated by Dr. Gaynell Face 11/27/2011 thank you

## 2011-11-27 NOTE — Progress Notes (Signed)
UR chart review completed.  

## 2011-11-27 NOTE — Transfer of Care (Signed)
Immediate Anesthesia Transfer of Care Note  Patient: [name]  Procedure(s) Performed:  CESAREAN SECTION  Patient Location: PACU  Anesthesia Type: Epidural  Level of Consciousness: awake, alert , oriented and patient cooperative  Airway & Oxygen Therapy: Patient Spontanous Breathing  Post-op Assessment: Report given to PACU RN and Post -op Vital signs reviewed and stable  Post vital signs: Reviewed and stable  Complications: No apparent anesthesia complications

## 2011-11-28 ENCOUNTER — Encounter (HOSPITAL_COMMUNITY): Payer: Self-pay | Admitting: Anesthesiology

## 2011-11-28 LAB — CBC
HCT: 20 % — ABNORMAL LOW (ref 36.0–46.0)
MCH: 27.3 pg (ref 26.0–34.0)
MCV: 82.6 fL (ref 78.0–100.0)
RBC: 2.42 MIL/uL — ABNORMAL LOW (ref 3.87–5.11)
WBC: 20.7 10*3/uL — ABNORMAL HIGH (ref 4.0–10.5)

## 2011-11-28 NOTE — Progress Notes (Signed)
Patient ID: [name], female   DOB: December 13, 1985, 26 y.o.   MRN: [id number] Postop day 1 Vital signs normal Fundus firm  Legs negative No complaints and and

## 2011-11-28 NOTE — Anesthesia Postprocedure Evaluation (Signed)
  Anesthesia Post-op Note  Patient: [name]  Procedure(s) Performed:  CESAREAN SECTION  Patient Location: PACU and Mother/Baby  Anesthesia Type: Epidural  Level of Consciousness: awake, alert  and oriented  Airway and Oxygen Therapy: Patient Spontanous Breathing  Post-op Pain: none  Post-op Assessment: Post-op Vital signs reviewed  Post-op Vital Signs: Reviewed and stable  Complications: No apparent anesthesia complications

## 2011-11-29 ENCOUNTER — Inpatient Hospital Stay (HOSPITAL_COMMUNITY)
Admission: AD | Admit: 2011-11-29 | Discharge: 2011-12-02 | DRG: 776 | Disposition: A | Discharge status: Home / Self Care | Payer: Medicaid Other | Source: Ambulatory Visit | Attending: Obstetrics | Admitting: Obstetrics

## 2011-11-29 ENCOUNTER — Encounter (HOSPITAL_COMMUNITY): Payer: Self-pay | Admitting: *Deleted

## 2011-11-29 DIAGNOSIS — O8612 Endometritis following delivery: Principal | ICD-10-CM | POA: Diagnosis present

## 2011-11-29 DIAGNOSIS — N719 Inflammatory disease of uterus, unspecified: Secondary | ICD-10-CM

## 2011-11-29 DIAGNOSIS — R109 Unspecified abdominal pain: Secondary | ICD-10-CM | POA: Diagnosis present

## 2011-11-29 MED ORDER — OXYCODONE-ACETAMINOPHEN 5-325 MG PO TABS: 1.0000 | ORAL_TABLET | ORAL | Status: DC | PRN

## 2011-11-29 MED ORDER — POLYSACCHARIDE IRON COMPLEX 100 MG/5ML PO ELIX: 100.0000 mg | ORAL_SOLUTION | Freq: Two times a day (BID) | ORAL | Status: DC

## 2011-11-29 MED ORDER — OXYCODONE-ACETAMINOPHEN 5-325 MG PO TABS
2.0000 | ORAL_TABLET | Freq: Once | ORAL | Status: AC
  Administered 2011-11-30: 2 via ORAL
  Filled 2011-11-29: qty 2

## 2011-11-29 MED ORDER — IBUPROFEN 600 MG PO TABS: 600.0000 mg | ORAL_TABLET | Freq: Four times a day (QID) | ORAL | Status: DC | PRN

## 2011-11-29 NOTE — Progress Notes (Signed)
Niece's baby's funeral was today, so pt asked for early discharge.  Hgb 6.6 today.  C/o h/a when rising, back pain- upper spine, had epidural in labor and got to pushing before having a c/s two days ago.  Febrile.  C/o leg swelling, states was not on feet much today.  Incision appears clear, well-approximated.  Is breastfeeding- states is going well.

## 2011-11-29 NOTE — Discharge Summary (Signed)
  Obstetric Discharge Summary Reason for Admission: onset of labor Prenatal Procedures: none Intrapartum Procedures: spontaneous vaginal delivery Postpartum Procedures: none Complications-Operative and Postpartum: none  Hemoglobin  Date Value Range Status  11/28/2011 6.6* 12.0-15.0 (g/dL) Final     DELTA CHECK NOTED     REPEATED TO VERIFY     CRITICAL RESULT CALLED TO, READ BACK BY AND VERIFIED WITH:     MCNABB, J. @ 0615 ON 11/28/11 BY BOVELL. A     HCT  Date Value Range Status  11/28/2011 20.0* 36.0-46.0 (%) Final    Discharge Diagnoses: Term Pregnancy-delivered  Discharge Information: Date: 11/29/2011 Activity: pelvic rest Diet: routine Medications:  Prior to Admission medications   Medication Sig Start Date End Date Taking? Authorizing Provider  ibuprofen (ADVIL,MOTRIN) 600 MG tablet Take 1 tablet (600 mg total) by mouth every 6 (six) hours as needed for pain. 11/29/11 12/09/11  Brock Bad, MD  oxyCODONE-acetaminophen (PERCOCET) 5-325 MG per tablet Take 1-2 tablets by mouth every 3 (three) hours as needed (moderate - severe pain). 11/29/11 12/09/11  Brock Bad, MD  polysaccharide iron complex (NIFEREX) 100 MG/5ML elixir Take 5 mLs (100 mg total) by mouth 2 (two) times daily. 11/29/11 11/28/12  Brock Bad, MD    Condition: stable Instructions: refer to routine discharge instructions Discharge to: home Follow-up Information    Follow up with Curtina Grills A, MD. Make an appointment in 2 weeks.   Contact information:   [address] [place] [address] [phone]          Newborn Data: Live born  Information for the patient's newborn:  Carlin, Girl Jeremie [[id number]]  female ; APGAR 9 - 9 , ; weight 3425 grams ;  Home with mother.  [place] A 11/29/2011, 9:43 AM

## 2011-11-29 NOTE — Progress Notes (Signed)
Subjective: Postpartum Day 2: Cesarean Delivery Patient reports tolerating PO.    Objective: Vital signs in last 24 hours: Temp:  [97.9 F (36.6 C)-98.6 F (37 C)] 98.6 F (37 C) (01/05 0521) Pulse Rate:  [85-93] 93  (01/05 0521) Resp:  [18] 18  (01/05 0521) BP: (103-110)/(58-66) 110/66 mmHg (01/05 0521)  Physical Exam:  General: alert and no distress Lochia: appropriate Uterine Fundus: firm Incision: healing well DVT Evaluation: No evidence of DVT seen on physical exam.   Basename 11/28/11 0535  HGB 6.6*  HCT 20.0*    Assessment/Plan: Status post Cesarean section. Doing well postoperatively.  Discharge home with standard precautions and return to clinic in 4-6 weeks.  HARPER,CHARLES A 11/29/2011, 9:26 AM

## 2011-11-29 NOTE — ED Provider Notes (Signed)
History     Chief Complaint  Patient presents with  . Post-op Problem  . Leg Swelling   HPI  Pt here s/p a repeat csection on 11/26/10 for failure to progress.  Discharged home today.  Here with reports of sharp incisional pain and dysuria.  Also reports fever and chills.  Pt reports light bleeding.  +leg swelling.  Denies complication during postpartum course.    Past Medical History  Diagnosis Date  . Headache   . History of chlamydia     Past Surgical History  Procedure Date  . Cesarean section   . Cesarean section 11/27/2011    Procedure: CESAREAN SECTION;  Surgeon: Kathreen Cosier, MD;  Location: [facility];  Service: Gynecology;  Laterality: N/A;    Family History  Problem Relation Age of Onset  . Hypertension Mother   . Alcohol abuse Mother   . Diabetes Mother   . Diabetes Father   . Hypertension Father   . Alcohol abuse Father   . Cancer Maternal Grandmother     History  Substance Use Topics  . Smoking status: Former Games developer  . Smokeless tobacco: Never Used  . Alcohol Use: No    Allergies:  Allergies  Allergen Reactions  . Penicillins Other (See Comments)    Childhood allergy; reaction unknown    Prescriptions prior to admission  Medication Sig Dispense Refill  . ibuprofen (ADVIL,MOTRIN) 600 MG tablet Take 1 tablet (600 mg total) by mouth every 6 (six) hours as needed for pain.  30 tablet  5  . oxyCODONE-acetaminophen (PERCOCET) 5-325 MG per tablet Take 1-2 tablets by mouth every 3 (three) hours as needed (moderate - severe pain).  40 tablet  0  . polysaccharide iron complex (NIFEREX) 100 MG/5ML elixir Take 5 mLs (100 mg total) by mouth 2 (two) times daily.  236 mL  12    Review of Systems  Constitutional: Positive for fever and chills.  Eyes: Negative.   Respiratory: Negative.   Cardiovascular: Negative.   Gastrointestinal: Positive for abdominal pain and constipation.  Genitourinary: Positive for dysuria and flank pain. Negative for frequency and  hematuria.  Neurological: Positive for headaches.   Physical Exam   Blood pressure 141/66, pulse 100, temperature 101.4 F (38.6 C), temperature source Oral, resp. rate 20, height 5\' 6"  (1.676 m), weight 98.998 kg (218 lb 4 oz), currently breastfeeding.  Physical Exam  Constitutional: She is oriented to person, place, and time. She appears well-developed and well-nourished. She appears distressed ( appears uncomfortable).  HENT:  Head: Normocephalic.  Neck: Normal range of motion. Neck supple.  Cardiovascular: Normal rate and regular rhythm.   Respiratory: Effort normal and breath sounds normal. No respiratory distress.  GI: Soft. There is tenderness. There is no rebound and no guarding.       Incision site healing well; well approximated, no signs of infection; no palpable mass.  Genitourinary: No bleeding around the vagina.  Neurological: She is alert and oriented to person, place, and time. She has normal reflexes.  Skin: Skin is dry.       Skin hot to touch    MAU Course  Procedures  Results for orders placed during the hospital encounter of 11/29/11 (from the past 24 hour(s))  CBC     Status: Abnormal   Collection Time   11/29/11 10:52 PM      Component Value Range   WBC 12.6 (*) 4.0 - 10.5 (K/uL)   RBC 2.46 (*) 3.87 - 5.11 (MIL/uL)  Hemoglobin 6.6 (*) 12.0 - 15.0 (g/dL)   HCT 16.1 (*) 09.6 - 46.0 (%)   MCV 82.5  78.0 - 100.0 (fL)   MCH 26.8  26.0 - 34.0 (pg)   MCHC 32.5  30.0 - 36.0 (g/dL)   RDW 04.5  40.9 - 81.1 (%)   Platelets 251  150 - 400 (K/uL)   Consult with Dr. Jackolyn Confer to postpartum for endometritis   Assessment and Plan  Endometritis  Plan: Admit to postpartum IV antibiotics  [facility] 11/29/2011, 11:24 PM

## 2011-11-29 NOTE — Progress Notes (Signed)
Pt states, " I had a C/S on Jan 3rd and was discharged today at 11:00. I got home and went to a funeral , and noticed that my ,legs were swollen. I  went back home and laid down and since then I couldn't get out of bed without help and I was having chills."

## 2011-11-29 NOTE — ED Notes (Signed)
W. Muhammed CNM in to see pt 

## 2011-11-30 LAB — CBC
HCT: 20.3 % — ABNORMAL LOW (ref 36.0–46.0)
Hemoglobin: 6.6 g/dL — CL (ref 12.0–15.0)
MCV: 82.5 fL (ref 78.0–100.0)
Platelets: 251 10*3/uL (ref 150–400)

## 2011-11-30 MED ORDER — SIMETHICONE 80 MG PO CHEW: 80.0000 mg | CHEWABLE_TABLET | ORAL | Status: DC | PRN

## 2011-11-30 MED ORDER — ONDANSETRON HCL 4 MG PO TABS: 4.0000 mg | ORAL_TABLET | ORAL | Status: DC | PRN

## 2011-11-30 MED ORDER — DIBUCAINE 1 % RE OINT
1.0000 "application " | TOPICAL_OINTMENT | RECTAL | Status: DC | PRN
  Administered 2011-11-30: 1 via RECTAL
  Filled 2011-11-30: qty 28

## 2011-11-30 MED ORDER — POLYSACCHARIDE IRON 150 MG PO CAPS
150.0000 mg | ORAL_CAPSULE | Freq: Two times a day (BID) | ORAL | Status: DC
  Administered 2011-11-30 – 2011-12-02 (×4): 150 mg via ORAL
  Filled 2011-11-30 (×5): qty 1

## 2011-11-30 MED ORDER — DIPHENHYDRAMINE HCL 25 MG PO CAPS: 25.0000 mg | ORAL_CAPSULE | Freq: Four times a day (QID) | ORAL | Status: DC | PRN

## 2011-11-30 MED ORDER — MAGNESIUM HYDROXIDE 400 MG/5ML PO SUSP
30.0000 mL | Freq: Every evening | ORAL | Status: DC | PRN
  Administered 2011-11-30: 30 mL via ORAL
  Filled 2011-11-30: qty 30

## 2011-11-30 MED ORDER — ONDANSETRON HCL 4 MG/2ML IJ SOLN
4.0000 mg | INTRAMUSCULAR | Status: DC | PRN
  Administered 2011-11-30: 4 mg via INTRAVENOUS
  Filled 2011-11-30: qty 2

## 2011-11-30 MED ORDER — CLINDAMYCIN PHOSPHATE 900 MG/50ML IV SOLN: 900.0000 mg | Freq: Three times a day (TID) | INTRAVENOUS | Status: DC

## 2011-11-30 MED ORDER — BENZOCAINE-MENTHOL 20-0.5 % EX AERO: 1.0000 "application " | INHALATION_SPRAY | CUTANEOUS | Status: DC | PRN

## 2011-11-30 MED ORDER — PRENATAL MULTIVITAMIN CH
1.0000 | ORAL_TABLET | Freq: Every day | ORAL | Status: DC
  Administered 2011-11-30 – 2011-12-02 (×3): 1 via ORAL
  Filled 2011-11-30 (×3): qty 1

## 2011-11-30 MED ORDER — WITCH HAZEL-GLYCERIN EX PADS
MEDICATED_PAD | CUTANEOUS | Status: DC | PRN
  Administered 2011-11-30: 23:00:00 via TOPICAL

## 2011-11-30 MED ORDER — BISACODYL 10 MG RE SUPP
10.0000 mg | Freq: Every day | RECTAL | Status: DC | PRN
  Administered 2011-12-01: 10 mg via RECTAL
  Filled 2011-11-30 (×2): qty 1

## 2011-11-30 MED ORDER — IBUPROFEN 600 MG PO TABS
600.0000 mg | ORAL_TABLET | Freq: Four times a day (QID) | ORAL | Status: DC | PRN
  Filled 2011-11-30: qty 1

## 2011-11-30 MED ORDER — LANOLIN HYDROUS EX OINT: TOPICAL_OINTMENT | CUTANEOUS | Status: DC | PRN

## 2011-11-30 MED ORDER — OXYCODONE-ACETAMINOPHEN 5-325 MG PO TABS
1.0000 | ORAL_TABLET | ORAL | Status: DC | PRN
  Administered 2011-11-30 (×2): 2 via ORAL
  Administered 2011-11-30 – 2011-12-01 (×4): 1 via ORAL
  Administered 2011-12-02: 2 via ORAL
  Filled 2011-11-30 (×3): qty 2
  Filled 2011-11-30: qty 1
  Filled 2011-11-30: qty 2

## 2011-11-30 MED ORDER — ZOLPIDEM TARTRATE 5 MG PO TABS
5.0000 mg | ORAL_TABLET | Freq: Every evening | ORAL | Status: DC | PRN
  Administered 2011-11-30: 5 mg via ORAL
  Filled 2011-11-30: qty 1

## 2011-11-30 MED ORDER — IBUPROFEN 600 MG PO TABS
600.0000 mg | ORAL_TABLET | Freq: Four times a day (QID) | ORAL | Status: DC | PRN
  Administered 2011-11-30 – 2011-12-01 (×4): 600 mg via ORAL
  Filled 2011-11-30 (×3): qty 1

## 2011-11-30 MED ORDER — DEXTROSE 5 % IV SOLN
Freq: Three times a day (TID) | INTRAVENOUS | Status: DC
  Administered 2011-11-30 – 2011-12-02 (×7): via INTRAVENOUS
  Filled 2011-11-30 (×8): qty 4.5

## 2011-11-30 MED ORDER — TETANUS-DIPHTH-ACELL PERTUSSIS 5-2.5-18.5 LF-MCG/0.5 IM SUSP
0.5000 mL | Freq: Once | INTRAMUSCULAR | Status: DC
  Filled 2011-11-30: qty 0.5

## 2011-11-30 MED ORDER — SODIUM CHLORIDE 0.9 % IV SOLN
INTRAVENOUS | Status: DC
  Administered 2011-11-30 (×2): via INTRAVENOUS

## 2011-11-30 MED ORDER — SENNOSIDES-DOCUSATE SODIUM 8.6-50 MG PO TABS
2.0000 | ORAL_TABLET | Freq: Every day | ORAL | Status: DC
  Administered 2011-11-30: 2 via ORAL

## 2011-11-30 MED ORDER — WITCH HAZEL-GLYCERIN EX PADS: 1.0000 "application " | MEDICATED_PAD | CUTANEOUS | Status: DC | PRN

## 2011-11-30 MED ORDER — OXYCODONE-ACETAMINOPHEN 5-325 MG PO TABS
1.0000 | ORAL_TABLET | ORAL | Status: DC | PRN
  Filled 2011-11-30 (×2): qty 1

## 2011-11-30 NOTE — Progress Notes (Signed)
Threasa Heads CNM in checking on pt. Will call MD

## 2011-11-30 NOTE — Progress Notes (Signed)
Subjective: Postpartum Day 4: Cesarean Delivery Patient reports  Lower abdominal pain.  Objective: Vital signs in last 24 hours: Temp:  [98.5 F (36.9 C)-101.4 F (38.6 C)] 98.5 F (36.9 C) (01/06 0611) Pulse Rate:  [84-100] 90  (01/06 0611) Resp:  [20] 20  (01/06 0611) BP: (120-141)/(57-84) 131/84 mmHg (01/06 0611) SpO2:  [80 %-97 %] 80 % (01/06 0611) Weight:  [98.998 kg (218 lb 4 oz)] 218 lb 4 oz (98.998 kg) (01/05 2220)  Physical Exam:  General: alert and no distress Lochia: appropriate Uterine Fundus: firm, tender. Incision: healing well DVT Evaluation: No evidence of DVT seen on physical exam.   Basename 11/29/11 2252 11/28/11 0535  HGB 6.6* 6.6*  HCT 20.3* 20.0*    Assessment/Plan: Status post Cesarean section.   Endometritis.  Continue Gent/Clinda. Continue current care.  Kagan Mutchler A 11/30/2011, 9:55 AM

## 2011-11-30 NOTE — Consult Note (Signed)
ANTIBIOTIC CONSULT NOTE - INITIAL  Pharmacy Consult for Gentamicin Indication: Endometritis  Allergies  Allergen Reactions  . Penicillins Other (See Comments)    Childhood allergy; reaction unknown    Patient Measurements: Height: 5\' 6"  (167.6 cm) Weight: 218 lb 4 oz (98.998 kg) IBW/kg (Calculated) : 59.3  Adjusted Body Weight: 71 kg  Vital Signs: Temp: 99 F (37.2 C) (01/06 0200) Temp src: Oral (01/06 0200) BP: 120/74 mmHg (01/06 0200) Pulse Rate: 84  (01/06 0200) Intake/Output from previous day:   Intake/Output from this shift:    Labs:  [facility] 11/29/11 2252 11/28/11 0535  WBC 12.6* 20.7*  HGB 6.6* 6.6*  PLT 251 208  LABCREA -- --  CREATININE -- --   Estimated Creatinine Clearance: 92 ml/min (by C-G formula based on Cr of 1.1). Microbiology: No results found for this or any previous visit (from the past 720 hour(s)).  Medical History: Past Medical History  Diagnosis Date  . Headache   . History of chlamydia     Medications: Clindamycin 900 mg IV every 8 hours Routine postpartum medications Assessment:  26 yo s/p C-Section 11/27/11, s/p hospital discharge 11/28/10, who presents with fever, chills and abdominal pain consistent with endometritis.   Goal of Therapy:  Gentamicin peaks 6-8 mg/dl, troughs < 1 mg/dl  Plan: Gentamicin 782 mg IV every 8 hours. Will check serum creatinine and gentamicin serum levels as appropriate.  Arelia Sneddon 11/30/2011,3:09 AM

## 2011-11-30 NOTE — ED Notes (Signed)
Report called to Rosario Adie RN on Women's Unit by Arlys John RN. Pt to [facility] via w/c

## 2011-11-30 NOTE — ED Notes (Signed)
0015 Lab called and pt's HGB 6.6. W.Muhammed CNM on unit and aware. No new orders.

## 2011-11-30 NOTE — Consult Note (Addendum)
Mom is a P2, with a Hgb of 6.6.  However, Mom is getting good yield with DEBP & is using a size 24 flange w/comfort. Mom is doing mixed feedings at home.  Mom states she is planning to buy an electric pump of her own to assist with feeding EBM. Olivia Mackie, Ran[place]er W[place]  Clindamycin & Gentamicin are both L2s. Lurline Hare [place]

## 2011-11-30 NOTE — H&P (Signed)
[name] is an 26 y.o. female. Presents with lower abdominal pain and fever.  S/P LTCS on 11-26-11.  Pertinent Gynecological History:   Menses: n/a Bleeding: normal lochia. Contraception: none DES exposure: denies Blood transfusions: none Sexually transmitted diseases:  none Previous GYN Procedures: n/a  Last mammogram: n/a Date: n/a Last pap: normal Date: 2012 OB History: G4, P2   Menstrual History: Menarche age: 52 No LMP recorded.    Past Medical History  Diagnosis Date  . Headache   . History of chlamydia     Past Surgical History  Procedure Date  . Cesarean section   . Cesarean section 11/27/2011    Procedure: CESAREAN SECTION;  Surgeon: Kathreen Cosier, MD;  Location: [facility];  Service: Gynecology;  Laterality: N/A;    Family History  Problem Relation Age of Onset  . Hypertension Mother   . Alcohol abuse Mother   . Diabetes Mother   . Diabetes Father   . Hypertension Father   . Alcohol abuse Father   . Cancer Maternal Grandmother     Social History:  reports that she has quit smoking. She has never used smokeless tobacco. She reports that she does not drink alcohol or use illicit drugs.  Allergies:  Allergies  Allergen Reactions  . Penicillins Other (See Comments)    Childhood allergy; reaction unknown    Prescriptions prior to admission  Medication Sig Dispense Refill  . ibuprofen (ADVIL,MOTRIN) 600 MG tablet Take 1 tablet (600 mg total) by mouth every 6 (six) hours as needed for pain.  30 tablet  5  . oxyCODONE-acetaminophen (PERCOCET) 5-325 MG per tablet Take 1-2 tablets by mouth every 3 (three) hours as needed (moderate - severe pain).  40 tablet  0  . polysaccharide iron complex (NIFEREX) 100 MG/5ML elixir Take 5 mLs (100 mg total) by mouth 2 (two) times daily.  236 mL  12    Review of Systems  Constitutional: Positive for fever and chills.  Gastrointestinal: Positive for abdominal pain.  All other systems reviewed and are  negative.    Blood pressure 131/84, pulse 90, temperature 98.5 F (36.9 C), temperature source Oral, resp. rate 20, height 5\' 6"  (1.676 m), weight 98.998 kg (218 lb 4 oz), SpO2 80.00%, currently breastfeeding. Physical Exam  Results for orders placed during the hospital encounter of 11/29/11 (from the past 24 hour(s))  CBC     Status: Abnormal   Collection Time   11/29/11 10:52 PM      Component Value Range   WBC 12.6 (*) 4.0 - 10.5 (K/uL)   RBC 2.46 (*) 3.87 - 5.11 (MIL/uL)   Hemoglobin 6.6 (*) 12.0 - 15.0 (g/dL)   HCT 47.8 (*) 29.5 - 46.0 (%)   MCV 82.5  78.0 - 100.0 (fL)   MCH 26.8  26.0 - 34.0 (pg)   MCHC 32.5  30.0 - 36.0 (g/dL)   RDW 62.1  30.8 - 65.7 (%)   Platelets 251  150 - 400 (K/uL)    No results found.  Assessment/Plan: Postpartum endometritis.  Admit.  IV antibiotics.  HARPER,CHARLES A 11/30/2011, 9:44 AM

## 2011-12-01 LAB — CREATININE, SERUM: GFR calc non Af Amer: 80 mL/min — ABNORMAL LOW (ref >90)

## 2011-12-01 NOTE — Progress Notes (Signed)
UR chart review completed.  

## 2011-12-01 NOTE — Progress Notes (Signed)
Patient ID: [name], female   DOB: 12-04-1985, 26 y.o.   MRN: [id number] Remains afebrile vital signs normal good bowel sounds Passing flatus Lochia moderate 1+ edema That Dulcolax suppository this a.m. Possible discharge in a him

## 2011-12-02 MED ORDER — MEDROXYPROGESTERONE ACETATE 150 MG/ML IM SUSP
150.0000 mg | Freq: Once | INTRAMUSCULAR | Status: AC
  Administered 2011-12-02: 150 mg via INTRAMUSCULAR
  Filled 2011-12-02: qty 1

## 2011-12-02 NOTE — Discharge Summary (Signed)
Patient had a C-section on 11/27/2011 and was discharged on 11/29/2011 she came back the same evening complaining of the vagina and a him low abdominal pain she had not had a bowel movement and on exam her incision was clean and dry she was started on IV antibiotics and admitted with a diagnosis of endometrioid this since admission she rapidly but defervesced and once she started moving her bowels and patient felt fine she remained afebrile and was discharged on 12/02/2011 on Percocet for pain and also clindamycin 300 by mouth every 6 hours for 10 days and

## 2011-12-05 ENCOUNTER — Inpatient Hospital Stay (EMERGENCY_DEPARTMENT_HOSPITAL)
Admission: AD | Admit: 2011-12-05 | Discharge: 2011-12-05 | Disposition: A | Discharge status: Home / Self Care | Payer: Medicaid Other | Source: Ambulatory Visit | Attending: Obstetrics | Admitting: Obstetrics

## 2011-12-05 ENCOUNTER — Inpatient Hospital Stay (HOSPITAL_COMMUNITY)
Admission: AD | Admit: 2011-12-05 | Discharge: 2011-12-05 | Disposition: A | Discharge status: Home / Self Care | Payer: Medicaid Other | Source: Ambulatory Visit | Attending: Obstetrics | Admitting: Obstetrics

## 2011-12-05 ENCOUNTER — Encounter (HOSPITAL_COMMUNITY): Payer: Self-pay | Admitting: *Deleted

## 2011-12-05 DIAGNOSIS — O909 Complication of the puerperium, unspecified: Secondary | ICD-10-CM | POA: Insufficient documentation

## 2011-12-05 DIAGNOSIS — O900 Disruption of cesarean delivery wound: Secondary | ICD-10-CM

## 2011-12-05 DIAGNOSIS — R1084 Generalized abdominal pain: Secondary | ICD-10-CM

## 2011-12-05 DIAGNOSIS — IMO0002 Reserved for concepts with insufficient information to code with codable children: Secondary | ICD-10-CM

## 2011-12-05 NOTE — Progress Notes (Signed)
PT HAS ARRIVED VIA EMS- HAD C/S ON 11-27-2011- DR MARSHALL- BREAST/ BOTTLE FEEDING.  WENT HOME ON 11-29-2011- SAT.  CAME BACK ON SAT NIGHT  FOR SWOLLEN LEGS AND CHILLS.   ADMITTED  HER  INFECTION IN C/S INCISION- GAVE IV- WITH ANTX.  WENT HOME ON Monday 7TH.   HAS BEEN OK AT HOME-  EXCEPT CRAMPING AND PAIN IN INCISION..   TONIGHT AT  WENT TO B-ROOM-  - WHEN GOT OFF TOILET-  BLOOD STARTED RUNNING DOWN LEGS.  ON ARRIVAL- HAD 1 STREAK OF DRIED BLOOD  DOWN LEGS.    INCISION ON L SIDE IS WET WITH  LIGHT AMT BLOOD.- TENDER TO [facility]

## 2011-12-05 NOTE — Progress Notes (Signed)
TOOK 1/2  1 TAB - PERCOCET.  TOOK  IBUPROFEN  AT 2300

## 2011-12-05 NOTE — ED Provider Notes (Signed)
History     No chief complaint on file.  HPI 26 y.o. [id number] at 7 days s/p c-section. Noted bleeding from left side of incision tonight, ongoing incisional pain and cramping, no worsening pain. Was d/c'd home on 11/28/10 then readmitted that night with PP endometritis for IV abx, d/c'd again on 1/7. Feeling well since then. Vaginal bleeding decreased.     Past Medical History  Diagnosis Date  . Headache   . History of chlamydia     Past Surgical History  Procedure Date  . Cesarean section   . Cesarean section 11/27/2011    Procedure: CESAREAN SECTION;  Surgeon: Kathreen Cosier, MD;  Location: [facility];  Service: Gynecology;  Laterality: N/A;    Family History  Problem Relation Age of Onset  . Hypertension Mother   . Alcohol abuse Mother   . Diabetes Mother   . Diabetes Father   . Hypertension Father   . Alcohol abuse Father   . Cancer Maternal Grandmother     History  Substance Use Topics  . Smoking status: Former Games developer  . Smokeless tobacco: Never Used  . Alcohol Use: No    Allergies:  Allergies  Allergen Reactions  . Penicillins Other (See Comments)    Childhood allergy; reaction unknown    No prescriptions prior to admission    Review of Systems  Constitutional: Negative.   Respiratory: Negative.   Cardiovascular: Negative.   Gastrointestinal: Positive for abdominal pain. Negative for nausea, vomiting, diarrhea and constipation.  Genitourinary: Negative for dysuria, urgency, frequency, hematuria and flank pain.       Positive for vaginal bleeding  Musculoskeletal: Negative.   Neurological: Negative.   Psychiatric/Behavioral: Negative.    Physical Exam   currently breastfeeding.  Physical Exam  Nursing note and vitals reviewed. Constitutional: She is oriented to person, place, and time. She appears well-developed and well-nourished. No distress.  Cardiovascular: Normal rate.   Respiratory: Effort normal.  GI: Soft. She exhibits no distension. There  is tenderness (slight).       1 cm superficial opening at left end of incision with small amount of bleeding, no erythema, tenderness around incision WNL, explored incision with sterile swab, small opening less than 0.5 cm wide and 2 cm deep noted, 10 ml of blood expressed, no active bleeding following, open area packed with iodoform guaze  Musculoskeletal: Normal range of motion.  Neurological: She is alert and oriented to person, place, and time.  Skin: Skin is warm and dry.    MAU Course  Procedures    Assessment and Plan  26 y.o. [id number] at 7 days s/p C/S with wound disruption Call Dr. Elsie Stain office in the morning for follow up Precautions rev'd  [name],[name] 12/05/2011, 1:59 AM

## 2011-12-05 NOTE — Progress Notes (Signed)
Pt here for recheck of c/s incision, was packed with iodoform gauze last night, called Dr. Elsie Stain office today, told to come to MAU for incision recheck. Had c/s on 11/27/2011 via Dr. Gaynell Face.

## 2011-12-05 NOTE — ED Provider Notes (Signed)
History   Pt presents today for incision check. She had a repeat C/Section on 11/27/11. She was seen yesterday in the MAU for a small defect on the left side of her incision. She denies fever, erythema, or purulent drainage. She has no other complaints at this time.   Chief Complaint  Patient presents with  . Wound Check   HPI  OB History    Grav Para Term Preterm Abortions TAB SAB Ect Mult Living   4 2 2  0 2 1 1  0 0 2      Past Medical History  Diagnosis Date  . Headache   . History of chlamydia     Past Surgical History  Procedure Date  . Cesarean section   . Cesarean section 11/27/2011    Procedure: CESAREAN SECTION;  Surgeon: Kathreen Cosier, MD;  Location: [facility];  Service: Gynecology;  Laterality: N/A;    Family History  Problem Relation Age of Onset  . Hypertension Mother   . Alcohol abuse Mother   . Diabetes Mother   . Diabetes Father   . Hypertension Father   . Alcohol abuse Father   . Cancer Maternal Grandmother     History  Substance Use Topics  . Smoking status: Former Games developer  . Smokeless tobacco: Never Used  . Alcohol Use: No    Allergies:  Allergies  Allergen Reactions  . Penicillins Other (See Comments)    Childhood allergy; reaction unknown    No prescriptions prior to admission    Review of Systems  Constitutional: Negative for fever, chills and malaise/fatigue.  Eyes: Negative for blurred vision.  Cardiovascular: Negative for chest pain and palpitations.  Gastrointestinal: Positive for abdominal pain. Negative for nausea, vomiting, diarrhea and constipation.  Genitourinary: Negative for dysuria, urgency, frequency and hematuria.  Neurological: Negative for dizziness and headaches.  Psychiatric/Behavioral: Negative for depression and suicidal ideas.   Physical Exam   Blood pressure 128/64, pulse 90, temperature 99 F (37.2 C), temperature source Oral, resp. rate 16, height 5\' 6"  (1.676 m), weight 219 lb (99.338 kg), currently  breastfeeding.  Physical Exam  Nursing note and vitals reviewed. Constitutional: She is oriented to person, place, and time. She appears well-developed and well-nourished. No distress.  HENT:  Head: Normocephalic and atraumatic.  Eyes: EOM are normal. Pupils are equal, round, and reactive to light.  GI: Soft. She exhibits no distension and no mass. There is tenderness. There is no rebound and no guarding.  Neurological: She is alert and oriented to person, place, and time.  Skin: Skin is warm and dry. She is not diaphoretic.       Small defect noted to left side of incision. Iodoform guaze removed without difficulty. Small amount of bloody fluid expressed. No purulent material noted. No erythema. No edema.  Psychiatric: She has a normal mood and affect. Her behavior is normal. Judgment and thought content normal.    MAU Course  Procedures    Assessment and Plan  Post op seroma: Incision appears to be healing nicely. Wound was not repacked. She will f/u with Dr. Gaynell Face next week. Discussed wound care instructions and precautions. Discussed diet, activity, risks, and precautions.  Clinton Gallant. Shakeela Rabadan III, DrHSc, MPAS, PA-C  12/05/2011, 1:43 PM   Henrietta Hoover, PA 12/05/11 1347

## 2013-04-27 ENCOUNTER — Inpatient Hospital Stay (HOSPITAL_COMMUNITY)
Admission: AD | Admit: 2013-04-27 | Discharge: 2013-04-27 | Disposition: A | Discharge status: Home / Self Care | Payer: Self-pay | Source: Ambulatory Visit | Attending: Obstetrics and Gynecology | Admitting: Obstetrics and Gynecology

## 2013-04-27 DIAGNOSIS — Z3202 Encounter for pregnancy test, result negative: Secondary | ICD-10-CM | POA: Insufficient documentation

## 2013-04-27 LAB — HCG, SERUM, QUALITATIVE: Preg, Serum: NEGATIVE

## 2013-04-27 LAB — POCT PREGNANCY, URINE: Preg Test, Ur: NEGATIVE

## 2013-04-27 NOTE — MAU Provider Note (Signed)
27 y.o. [name] here requesting pregnancy verification. States she missed her depo shot in April and has had 3 positive UPTs at home. Planning termination.   BP 121/84  Pulse 85  Temp(Src) 98.8 F (37.1 C) (Oral)  Resp 18  Ht 5\' 6"  (1.676 m)  Wt 203 lb (92.08 kg)  BMI 32.78 kg/m2  LMP 02/22/2013  Breastfeeding? No  Gen: well, no distress  Results for orders placed during the hospital encounter of 04/27/13 (from the past 24 hour(s))  POCT PREGNANCY, URINE     Status: None   Collection Time    04/27/13 10:59 AM      Result Value Range   Preg Test, Ur NEGATIVE  NEGATIVE  HCG, SERUM, QUALITATIVE     Status: None   Collection Time    04/27/13 11:09 AM      Result Value Range   Preg, Serum NEGATIVE  NEGATIVE   A/P: Negative urine and serum pregnancy test today F/U with Dr. Gaynell Face for depo provera

## 2013-04-27 NOTE — MAU Provider Note (Signed)
Attestation of Attending Supervision of Advanced Practitioner (CNM/NP): Evaluation and management procedures were performed by the Advanced Practitioner under my supervision and collaboration.  I have reviewed the Advanced Practitioner's note and chart, and I agree with the management and plan.  Harjot Zavadil 04/27/2013 1:35 PM    

## 2013-04-27 NOTE — MAU Note (Signed)
Pt states was on Depo injection and due to MD office mix-up with insurance was unable to get Depo injection on time. Had three positive upt's. Here for verification letter and requests u/s for confirmation of dates. Plans on TAB.

## 2013-04-27 NOTE — MAU Note (Signed)
NFrazier, CNM in to speak to pt WU:[id number] upt in MAU. Has ordered qualitative BHCG, and after blood draw pt may leave and be called with results. Pt still denies pain or bleeding.

## 2013-09-17 ENCOUNTER — Encounter (HOSPITAL_COMMUNITY): Payer: Self-pay | Admitting: Emergency Medicine

## 2013-09-17 ENCOUNTER — Emergency Department (INDEPENDENT_AMBULATORY_CARE_PROVIDER_SITE_OTHER)
Admission: EM | Admit: 2013-09-17 | Discharge: 2013-09-17 | Disposition: A | Discharge status: Home / Self Care | Payer: BC Managed Care – PPO | Source: Home / Self Care | Attending: Emergency Medicine | Admitting: Emergency Medicine

## 2013-09-17 DIAGNOSIS — R05 Cough: Secondary | ICD-10-CM

## 2013-09-17 DIAGNOSIS — J988 Other specified respiratory disorders: Secondary | ICD-10-CM

## 2013-09-17 DIAGNOSIS — R059 Cough, unspecified: Secondary | ICD-10-CM

## 2013-09-17 DIAGNOSIS — J029 Acute pharyngitis, unspecified: Secondary | ICD-10-CM

## 2013-09-17 MED ORDER — HYDROCODONE-HOMATROPINE 5-1.5 MG/5ML PO SYRP: 5.0000 mL | ORAL_SOLUTION | Freq: Four times a day (QID) | ORAL | Status: DC | PRN

## 2013-09-17 MED ORDER — AZITHROMYCIN 250 MG PO TABS: 250.0000 mg | ORAL_TABLET | Freq: Every day | ORAL | Status: DC

## 2013-09-17 NOTE — ED Notes (Signed)
Reports generalized aching, unknown fever, reports cough, runny nose, headache, sore throat and denies any ear pain.  Onset 2 weeks ago of symptoms

## 2013-09-17 NOTE — ED Provider Notes (Signed)
CSN: [id number]     Arrival date & time 09/17/13  1057 History   First MD Initiated Contact with Patient 09/17/13 1216     Chief Complaint  Patient presents with  . URI   (Consider location/radiation/quality/duration/timing/severity/associated sxs/prior Treatment) HPI Comments: Patient presents with 2 weeks of body aches, headaches and sore throat. In the last few days this has worsened. Now with subjective fevers, increased sore throat, cough (non-productive) and overall malaise. She is also due to work today, and would like a work note.   Patient is a 27 y.o. female presenting with URI.  URI Presenting symptoms: congestion, cough, fatigue, fever and sore throat   Associated symptoms: myalgias   Associated symptoms: no wheezing     Past Medical History  Diagnosis Date  . Headache(784.0)   . History of chlamydia    Past Surgical History  Procedure Laterality Date  . Cesarean section    . Cesarean section  11/27/2011    Procedure: CESAREAN SECTION;  Surgeon: Kathreen Cosier, MD;  Location: [facility];  Service: Gynecology;  Laterality: N/A;   Family History  Problem Relation Age of Onset  . Hypertension Mother   . Alcohol abuse Mother   . Diabetes Mother   . Diabetes Father   . Hypertension Father   . Alcohol abuse Father   . Cancer Maternal Grandmother    History  Substance Use Topics  . Smoking status: Current Every Day Smoker  . Smokeless tobacco: Never Used  . Alcohol Use: No   OB History   Grav Para Term Preterm Abortions TAB SAB Ect Mult Living   4 2 2  0 2 1 1  0 0 2     Review of Systems  Constitutional: Positive for fever and fatigue.  HENT: Positive for congestion and sore throat.   Eyes: Negative.   Respiratory: Positive for cough. Negative for shortness of breath and wheezing.   Cardiovascular: Negative.   Musculoskeletal: Positive for myalgias.  Skin: Negative.   Allergic/Immunologic: Negative.     Allergies  Penicillins  Home Medications    Current Outpatient Rx  Name  Route  Sig  Dispense  Refill  . Acetaminophen (TYLENOL PO)   Oral   Take by mouth.         . Phenyleph-CPM-DM-Aspirin (ALKA-SELTZER PLUS COLD & COUGH PO)   Oral   Take by mouth.         . Pseudoeph-Doxylamine-DM-APAP (NYQUIL PO)   Oral   Take by mouth.         . Pseudoephedrine-APAP-DM (DAYQUIL PO)   Oral   Take by mouth.         Marland Kitchen azithromycin (ZITHROMAX) 250 MG tablet   Oral   Take 1 tablet (250 mg total) by mouth daily. Take first 2 tablets together, then 1 every day until finished.   6 tablet   0   . HYDROcodone-homatropine (HYCODAN) 5-1.5 MG/5ML syrup   Oral   Take 5 mLs by mouth every 6 (six) hours as needed for cough.   180 mL   0    BP 112/73  Pulse 76  Temp(Src) 99.9 F (37.7 C) (Oral)  Resp 18  SpO2 100% Physical Exam  Constitutional: She is oriented to person, place, and time. She appears well-developed and well-nourished.  Lying on the exam table  HENT:  Head: Normocephalic and atraumatic.  Right Ear: External ear normal.  Left Ear: External ear normal.  Oro pharynx with 2+ tonsils, and injection. No obvious exudate  Eyes: Conjunctivae are normal. Pupils are equal, round, and reactive to light.  Neck: Normal range of motion.  Cardiovascular: Normal rate, regular rhythm and normal heart sounds.   Pulmonary/Chest: Effort normal. She has no wheezes. She has no rales.  Course BS in the bases, no crackles  Neurological: She is alert and oriented to person, place, and time.  Skin: Skin is warm and dry.  Psychiatric: Her behavior is normal.    ED Course  Procedures (including critical care time) Labs Review Labs Reviewed  CULTURE, GROUP A STREP   Imaging Review No results found.  EKG Interpretation     Ventricular Rate:    PR Interval:    QRS Duration:   QT Interval:    QTC Calculation:   R Axis:     Text Interpretation:              MDM   1. Respiratory infection   2. Cough   3. Sore  throat    2 week duration treat empirically with ABX for throat and lung etiology. Strep pending at d/c. Will f/u. F/U if worsens. Work note given.     Riki Sheer, PA-C 09/24/13 [phone]

## 2013-09-19 LAB — CULTURE, GROUP A STREP

## 2013-09-19 NOTE — ED Notes (Signed)
Lab review

## 2013-09-19 NOTE — ED Notes (Signed)
Report pending.

## 2013-09-24 NOTE — ED Provider Notes (Signed)
Medical screening examination/treatment/procedure(s) were performed by non-physician practitioner and as supervising physician I was immediately available for consultation/collaboration.  Casy Brunetto, M.D.  Aydin Hink C Marcie Shearon, MD 09/24/13 1035 

## 2014-04-03 ENCOUNTER — Other Ambulatory Visit: Payer: Self-pay | Admitting: Obstetrics

## 2014-04-24 NOTE — H&P (Signed)
NAME:  KERRIS, LAUT NO.:  MEDICAL RECORD NO.:  [id number]  LOCATION:                                 FACILITY:  PHYSICIAN:  Kathreen Cosier, M.D.   DATE OF BIRTH:  DATE OF ADMISSION: DATE OF DISCHARGE:                             HISTORY & PHYSICAL   HISTORY OF PRESENT ILLNESS:  The patient is a 28 year old, gravida 4, para 2-0-2-2, who is now on Depo-Provera and desires sterilization, understands 1:250 can get pregnant in the tube or a uterus after a tubal ligation.  PAST MEDICAL HISTORY:  Negative.  PAST SURGICAL HISTORY:  C-sections x2.  SOCIAL HISTORY:  Negative.  SYSTEM REVIEW:  Allergic to PENICILLIN.  PHYSICAL EXAMINATION:  GENERAL:  Well-developed female in no distress. HEENT:  Negative. LUNGS:  Clear to P and A. HEART:  Regular rhythm.  No murmurs, no gallops. BREASTS:  Negative. ABDOMEN:  Negative. PELVIC:  Uterus normal, negative adnexa.  Pap smear negative. EXTREMITIES:  Negative.          ______________________________ Kathreen Cosier, M.D.     BAM/MEDQ  D:  04/24/2014  T:  04/24/2014  Job:  [id number]

## 2014-05-03 ENCOUNTER — Encounter (HOSPITAL_COMMUNITY): Payer: Self-pay

## 2014-05-03 ENCOUNTER — Encounter (HOSPITAL_COMMUNITY): Payer: BC Managed Care – PPO

## 2014-05-03 ENCOUNTER — Encounter (HOSPITAL_COMMUNITY)
Admission: RE | Disposition: A | Discharge status: Home / Self Care | Payer: Self-pay | Source: Ambulatory Visit | Attending: Obstetrics

## 2014-05-03 ENCOUNTER — Ambulatory Visit (HOSPITAL_COMMUNITY)
Admission: RE | Admit: 2014-05-03 | Discharge: 2014-05-03 | Disposition: A | Discharge status: Home / Self Care | Payer: BC Managed Care – PPO | Source: Ambulatory Visit | Attending: Obstetrics | Admitting: Obstetrics

## 2014-05-03 ENCOUNTER — Ambulatory Visit (HOSPITAL_COMMUNITY): Payer: BC Managed Care – PPO

## 2014-05-03 DIAGNOSIS — Z880 Allergy status to penicillin: Secondary | ICD-10-CM | POA: Insufficient documentation

## 2014-05-03 DIAGNOSIS — Z302 Encounter for sterilization: Secondary | ICD-10-CM | POA: Insufficient documentation

## 2014-05-03 DIAGNOSIS — F17200 Nicotine dependence, unspecified, uncomplicated: Secondary | ICD-10-CM | POA: Insufficient documentation

## 2014-05-03 DIAGNOSIS — Z641 Problems related to multiparity: Secondary | ICD-10-CM | POA: Insufficient documentation

## 2014-05-03 HISTORY — PX: LAPAROSCOPIC TUBAL LIGATION: SHX1937

## 2014-05-03 LAB — CBC
HCT: 40.6 % (ref 36.0–46.0)
Hemoglobin: 13.6 g/dL (ref 12.0–15.0)
MCH: 28.3 pg (ref 26.0–34.0)
MCHC: 33.5 g/dL (ref 30.0–36.0)
MCV: 84.6 fL (ref 78.0–100.0)
PLATELETS: 245 10*3/uL (ref 150–400)
RBC: 4.8 MIL/uL (ref 3.87–5.11)
RDW: 14.9 % (ref 11.5–15.5)
WBC: 11.1 10*3/uL — ABNORMAL HIGH (ref 4.0–10.5)

## 2014-05-03 LAB — PREGNANCY, URINE: Preg Test, Ur: NEGATIVE

## 2014-05-03 SURGERY — LIGATION, FALLOPIAN TUBE, LAPAROSCOPIC
Anesthesia: General | Site: Abdomen

## 2014-05-03 MED ORDER — LIDOCAINE HCL (CARDIAC) 20 MG/ML IV SOLN
INTRAVENOUS | Status: DC | PRN
  Administered 2014-05-03: 80 mg via INTRAVENOUS

## 2014-05-03 MED ORDER — LIDOCAINE HCL (CARDIAC) 20 MG/ML IV SOLN
INTRAVENOUS | Status: AC
  Filled 2014-05-03: qty 5

## 2014-05-03 MED ORDER — FENTANYL CITRATE 0.05 MG/ML IJ SOLN
INTRAMUSCULAR | Status: AC
  Filled 2014-05-03: qty 2

## 2014-05-03 MED ORDER — NEOSTIGMINE METHYLSULFATE 10 MG/10ML IV SOLN
INTRAVENOUS | Status: AC
  Filled 2014-05-03: qty 1

## 2014-05-03 MED ORDER — FENTANYL CITRATE 0.05 MG/ML IJ SOLN
INTRAMUSCULAR | Status: DC | PRN
  Administered 2014-05-03: 50 ug via INTRAVENOUS
  Administered 2014-05-03: 100 ug via INTRAVENOUS
  Administered 2014-05-03 (×2): 50 ug via INTRAVENOUS

## 2014-05-03 MED ORDER — ROCURONIUM BROMIDE 100 MG/10ML IV SOLN
INTRAVENOUS | Status: DC | PRN
  Administered 2014-05-03: 10 mg via INTRAVENOUS

## 2014-05-03 MED ORDER — PROPOFOL 10 MG/ML IV EMUL
INTRAVENOUS | Status: AC
  Filled 2014-05-03: qty 20

## 2014-05-03 MED ORDER — DEXAMETHASONE SODIUM PHOSPHATE 10 MG/ML IJ SOLN
INTRAMUSCULAR | Status: AC
  Filled 2014-05-03: qty 1

## 2014-05-03 MED ORDER — ONDANSETRON HCL 4 MG/2ML IJ SOLN
INTRAMUSCULAR | Status: AC
  Filled 2014-05-03: qty 2

## 2014-05-03 MED ORDER — PROPOFOL 10 MG/ML IV BOLUS
INTRAVENOUS | Status: DC | PRN
  Administered 2014-05-03: 200 mg via INTRAVENOUS

## 2014-05-03 MED ORDER — KETOROLAC TROMETHAMINE 30 MG/ML IJ SOLN
INTRAMUSCULAR | Status: AC
  Filled 2014-05-03: qty 1

## 2014-05-03 MED ORDER — MIDAZOLAM HCL 2 MG/2ML IJ SOLN
INTRAMUSCULAR | Status: DC | PRN
  Administered 2014-05-03: 2 mg via INTRAVENOUS

## 2014-05-03 MED ORDER — ROCURONIUM BROMIDE 100 MG/10ML IV SOLN
INTRAVENOUS | Status: AC
  Filled 2014-05-03: qty 1

## 2014-05-03 MED ORDER — MIDAZOLAM HCL 2 MG/2ML IJ SOLN
INTRAMUSCULAR | Status: AC
  Filled 2014-05-03: qty 2

## 2014-05-03 MED ORDER — ONDANSETRON HCL 4 MG/2ML IJ SOLN
INTRAMUSCULAR | Status: DC | PRN
  Administered 2014-05-03: 4 mg via INTRAVENOUS

## 2014-05-03 MED ORDER — KETOROLAC TROMETHAMINE 30 MG/ML IJ SOLN
INTRAMUSCULAR | Status: DC | PRN
  Administered 2014-05-03: 30 mg via INTRAVENOUS

## 2014-05-03 MED ORDER — FENTANYL CITRATE 0.05 MG/ML IJ SOLN
INTRAMUSCULAR | Status: AC
  Filled 2014-05-03: qty 5

## 2014-05-03 MED ORDER — SUCCINYLCHOLINE CHLORIDE 20 MG/ML IJ SOLN
INTRAMUSCULAR | Status: DC | PRN
  Administered 2014-05-03: 120 mg via INTRAVENOUS

## 2014-05-03 MED ORDER — LACTATED RINGERS IV SOLN
INTRAVENOUS | Status: DC
  Administered 2014-05-03 (×3): via INTRAVENOUS

## 2014-05-03 MED ORDER — GLYCOPYRROLATE 0.2 MG/ML IJ SOLN
INTRAMUSCULAR | Status: DC | PRN
  Administered 2014-05-03: 0.1 mg via INTRAVENOUS

## 2014-05-03 MED ORDER — FENTANYL CITRATE 0.05 MG/ML IJ SOLN
25.0000 ug | INTRAMUSCULAR | Status: DC | PRN
  Administered 2014-05-03 (×2): 50 ug via INTRAVENOUS

## 2014-05-03 MED ORDER — DEXAMETHASONE SODIUM PHOSPHATE 10 MG/ML IJ SOLN
INTRAMUSCULAR | Status: DC | PRN
  Administered 2014-05-03: 10 mg via INTRAVENOUS

## 2014-05-03 MED ORDER — FENTANYL CITRATE 0.05 MG/ML IJ SOLN
INTRAMUSCULAR | Status: AC
  Administered 2014-05-03: 50 ug via INTRAVENOUS
  Filled 2014-05-03: qty 2

## 2014-05-03 SURGICAL SUPPLY — 16 items
CATH ROBINSON RED A/P 16FR (CATHETERS) ×2 IMPLANT
CLOTH BEACON ORANGE TIMEOUT ST (SAFETY) ×2 IMPLANT
DERMABOND ADVANCED (GAUZE/BANDAGES/DRESSINGS) ×1
DERMABOND ADVANCED .7 DNX12 (GAUZE/BANDAGES/DRESSINGS) ×1 IMPLANT
DRESSING OPSITE X SMALL 2X3 (GAUZE/BANDAGES/DRESSINGS) ×2 IMPLANT
DRSG COVADERM PLUS 2X2 (GAUZE/BANDAGES/DRESSINGS) ×4 IMPLANT
DRSG OPSITE POSTOP 3X4 (GAUZE/BANDAGES/DRESSINGS) ×2 IMPLANT
GLOVE BIO SURGEON STRL SZ8.5 (GLOVE) ×4 IMPLANT
GOWN STRL REUS W/TWL 2XL LVL3 (GOWN DISPOSABLE) ×2 IMPLANT
GOWN STRL REUS W/TWL LRG LVL3 (GOWN DISPOSABLE) ×2 IMPLANT
PACK LAPAROSCOPY BASIN (CUSTOM PROCEDURE TRAY) ×2 IMPLANT
SUT MON AB 4-0 PS1 27 (SUTURE) ×2 IMPLANT
SUT VIC AB 0 CT1 27 (SUTURE) ×1
SUT VIC AB 0 CT1 27XBRD ANBCTR (SUTURE) ×1 IMPLANT
TOWEL OR 17X24 6PK STRL BLUE (TOWEL DISPOSABLE) ×4 IMPLANT
WATER STERILE IRR 1000ML POUR (IV SOLUTION) ×2 IMPLANT

## 2014-05-03 NOTE — Anesthesia Preprocedure Evaluation (Signed)
Anesthesia Evaluation  Patient identified by MRN, date of birth, ID band Patient awake    Reviewed: Allergy & Precautions, H&P , Patient's Chart, lab work & pertinent test results, reviewed documented beta blocker date and time   Airway Mallampati: II  TM Distance: >3 FB Neck ROM: full    Dental no notable dental hx.    Pulmonary Current Smoker,  breath sounds clear to auscultation  Pulmonary exam normal       Cardiovascular Rhythm:regular Rate:Normal     Neuro/Psych    GI/Hepatic   Endo/Other    Renal/GU      Musculoskeletal   Abdominal   Peds  Hematology   Anesthesia Other Findings   Reproductive/Obstetrics                             Anesthesia Physical Anesthesia Plan  ASA: II  Anesthesia Plan: General   Post-op Pain Management:    Induction: Intravenous  Airway Management Planned: Oral ETT  Additional Equipment:   Intra-op Plan:   Post-operative Plan: Extubation in OR  Informed Consent: I have reviewed the patients History and Physical, chart, labs and discussed the procedure including the risks, benefits and alternatives for the proposed anesthesia with the patient or authorized representative who has indicated his/her understanding and acceptance.   Dental Advisory Given and Dental advisory given  Plan Discussed with: CRNA and Surgeon  Anesthesia Plan Comments: (  Discussed general anesthesia, including possible nausea, instrumentation of airway, sore throat,pulmonary aspiration, etc. I asked if the were any outstanding questions, or  concerns before we proceeded. )        Anesthesia Quick Evaluation  

## 2014-05-03 NOTE — H&P (Signed)
  There has been no change in the history and physical since the original dictation 

## 2014-05-03 NOTE — Discharge Instructions (Signed)
Laparoscopy for Tubal Ligation Care After Laparoscopic tubal ligation is an operation done with a long, lighted tube inserted through a small cut (incision) in the abdomen. The fallopian tubes are blocked by tying, clamping with a plastic clamp, or burning them closed with an electrocautery. Read the instructions outlined below and refer to this sheet in the next few weeks. These instructions provide you with general information on caring for yourself after you leave the hospital. Your caregiver may also give you specific instructions. While your treatment has been planned according to the most current medical practices available, unavoidable complications may occur. If you have any problems or questions after discharge, please call your caregiver. HOME CARE INSTRUCTIONS  It will be normal to be sore for a couple days following surgery.   Take your medicine and follow the instructions from your caregiver.   You may resume usual diet, exercise, driving and activities as allowed by your caregiver.   Do not have sexual intercourse until your caregiver gives you permission.   Do not drive while taking pain medicine.   Avoid lifting until you are instructed otherwise.   Use showers for bathing, until you are seen by your caregiver.   Change dressings if needed, and as directed.   Only take over-the-counter or prescription medicines for pain, discomfort or fever as directed by your caregiver.   Do not take aspirin because it can cause bleeding.   Take your temperature twice a day and record it.   Have someone stay with you the day you have the operation, and for a couple days afterward.   Make an appointment to see your caregiver for stitches (sutures) or staple removal and postoperative exams, as instructed.  SEEK MEDICAL CARE IF:  There is redness, swelling, or increasing pain in a wound.   There is drainage from a wound lasting longer than one day.   Your pain is getting worse.    You develop a rash.   You are having a reaction to your medicine.   You become dizzy or lightheaded.   You need stronger medicine or a change in your pain medicine.   You notice a foul smell coming from a wound or dressing.   There is a breaking open of a wound after the stitches, staples, or skin adhesive strips have been removed.   You develop constipation.  SEEK IMMEDIATE MEDICAL CARE IF:  You have an oral temperature above 100.4, not controlled by medicine.   There is increasing abdominal pain.   You develop pain in your shoulders (shoulder strap areas) which becomes more severe. Some pain is common, because of the gas inserted into your abdomen during the procedure.   You develop bleeding or drainage from the suture sites or vagina (birth canal) following surgery.   You pass out.   You develop shortness of breath or difficulty breathing.   You develop chest or leg pain.   You develop persistent nausea, vomiting or diarrhea.  MAKE SURE YOU:   Understand these instructions.   Watch your condition.   Get help right away if you are not doing well or get worse.  Document Released: 05/30/2005 Document Re-Released: 04/30/2010 [facility] Patient Information 2011 [place], [place].

## 2014-05-03 NOTE — Transfer of Care (Signed)
Immediate Anesthesia Transfer of Care Note  Patient: [name]  Procedure(s) Performed: Procedure(s): LAPAROSCOPIC TUBAL LIGATION (N/A)  Patient Location: PACU  Anesthesia Type:General  Level of Consciousness: awake, alert  and oriented  Airway & Oxygen Therapy: Patient Spontanous Breathing and Patient connected to nasal cannula oxygen  Post-op Assessment: Report given to PACU RN and Post -op Vital signs reviewed and stable  Post vital signs: Reviewed and stable  Complications: No apparent anesthesia complications

## 2014-05-03 NOTE — Op Note (Signed)
Preop diagnosis multiparity Postop diagnosis same laparoscopic tubal sterilization Anesthesia Gen. Surgeon Dr. Francoise Ceo Procedure  Under  general anesthesia patient in the lithotomy position abdomen prepped and draped perineum and vagina prepped and draped and the bladder emptied with a straight catheter a speculum placed in the vagina and the cervix grasped with a Hulka tenaculum in the umbilicus a transverse incision made carried down to the fascia fascia cleaned grasped  With two kochers  and the fascia and the peritoneum  Opened with  the Mayo scissors the sleeve of the trocar inserted intraperitoneally and 3 L carbon dioxide and he was intraperitoneally  visualizying  scope inserted through the sleeve of the trocar uterus tubes ovaries normal there was some filmy adhesions above the uterus the cautery probe inserted through the sleeve of the scope in the right tube grasped  1 inch from the  Cornu  and cauterize the tube was cauterized a total of 4 places  Moving l from the first site of  Cautery  the procedure was done in a similar fashion on  The other  side the  Probes  removed CO2 allowed to escape from the peritoneal cavity fascia closed  With one stitch  0 Dexon skin shows a subcuticular stitch of 4-0 Monocryl

## 2014-05-03 NOTE — Anesthesia Procedure Notes (Signed)
Procedure Name: Intubation Date/Time: 05/03/2014 10:52 AM Performed by: Francoise Ceo A Pre-anesthesia Checklist: Patient identified, Emergency Drugs available, Suction available and Patient being monitored Patient Re-evaluated:Patient Re-evaluated prior to inductionOxygen Delivery Method: Circle system utilized Preoxygenation: Pre-oxygenation with 100% oxygen Intubation Type: IV induction Ventilation: Mask ventilation without difficulty Laryngoscope Size: Miller and 2 Grade View: Grade I Tube size: 7.0 mm Number of attempts: 1 Airway Equipment and Method: Stylet Placement Confirmation: ETT inserted through vocal cords under direct vision,  positive ETCO2 and breath sounds checked- equal and bilateral Secured at: 20 cm Tube secured with: Tape Dental Injury: Teeth and Oropharynx as per pre-operative assessment

## 2014-05-04 ENCOUNTER — Encounter (HOSPITAL_COMMUNITY): Payer: Self-pay | Admitting: Obstetrics

## 2014-05-04 NOTE — Anesthesia Postprocedure Evaluation (Signed)
  Anesthesia Post-op Note  Patient: [name]  Procedure(s) Performed: Procedure(s): LAPAROSCOPIC TUBAL LIGATION (N/A) Patient is awake and responsive. Pain and nausea are reasonably well controlled. Vital signs are stable and clinically acceptable. Oxygen saturation is clinically acceptable. There are no apparent anesthetic complications at this time. Patient is ready for discharge.

## 2014-09-25 ENCOUNTER — Encounter (HOSPITAL_COMMUNITY): Payer: Self-pay | Admitting: Obstetrics

## 2016-08-22 LAB — PROCEDURE REPORT - SCANNED: PAP SMEAR: NEGATIVE

## 2016-09-08 ENCOUNTER — Encounter (HOSPITAL_COMMUNITY): Payer: Self-pay | Admitting: Emergency Medicine

## 2016-09-08 ENCOUNTER — Emergency Department (HOSPITAL_COMMUNITY): Payer: Self-pay

## 2016-09-08 ENCOUNTER — Emergency Department (HOSPITAL_COMMUNITY)
Admission: EM | Admit: 2016-09-08 | Discharge: 2016-09-08 | Disposition: A | Discharge status: Home / Self Care | Payer: Self-pay | Attending: Emergency Medicine | Admitting: Emergency Medicine

## 2016-09-08 DIAGNOSIS — J029 Acute pharyngitis, unspecified: Secondary | ICD-10-CM | POA: Insufficient documentation

## 2016-09-08 DIAGNOSIS — F17200 Nicotine dependence, unspecified, uncomplicated: Secondary | ICD-10-CM | POA: Insufficient documentation

## 2016-09-08 DIAGNOSIS — Z5321 Procedure and treatment not carried out due to patient leaving prior to being seen by health care provider: Secondary | ICD-10-CM | POA: Insufficient documentation

## 2016-09-08 LAB — COMPREHENSIVE METABOLIC PANEL
ALT: 12 U/L — AB (ref 14–54)
AST: 14 U/L — ABNORMAL LOW (ref 15–41)
Albumin: 3.8 g/dL (ref 3.5–5.0)
Alkaline Phosphatase: 56 U/L (ref 38–126)
Anion gap: 7 (ref 5–15)
BUN: 8 mg/dL (ref 6–20)
CO2: 25 mmol/L (ref 22–32)
CREATININE: 0.99 mg/dL (ref 0.44–1.00)
Calcium: 9.2 mg/dL (ref 8.9–10.3)
Chloride: 104 mmol/L (ref 101–111)
GFR calc Af Amer: >60 mL/min (ref >60)
Glucose, Bld: 84 mg/dL (ref 65–99)
Potassium: 3.5 mmol/L (ref 3.5–5.1)
Sodium: 136 mmol/L (ref 135–145)
Total Bilirubin: 0.9 mg/dL (ref 0.3–1.2)
Total Protein: 7.1 g/dL (ref 6.5–8.1)

## 2016-09-08 LAB — CBC WITH DIFFERENTIAL/PLATELET
BASOS ABS: 0 10*3/uL (ref 0.0–0.1)
Basophils Relative: 0 %
Eosinophils Absolute: 0 10*3/uL (ref 0.0–0.7)
Eosinophils Relative: 0 %
HCT: 38.9 % (ref 36.0–46.0)
HEMOGLOBIN: 13.2 g/dL (ref 12.0–15.0)
Lymphocytes Relative: 15 %
Lymphs Abs: 2.1 10*3/uL (ref 0.7–4.0)
MCH: 28.8 pg (ref 26.0–34.0)
MCHC: 33.9 g/dL (ref 30.0–36.0)
MCV: 84.7 fL (ref 78.0–100.0)
Monocytes Absolute: 1.9 10*3/uL — ABNORMAL HIGH (ref 0.1–1.0)
Monocytes Relative: 14 %
Neutro Abs: 10 10*3/uL — ABNORMAL HIGH (ref 1.7–7.7)
Neutrophils Relative %: 71 %
Platelets: 206 10*3/uL (ref 150–400)
RBC: 4.59 MIL/uL (ref 3.87–5.11)
RDW: 13.9 % (ref 11.5–15.5)
WBC: 14.1 10*3/uL — AB (ref 4.0–10.5)

## 2016-09-08 MED ORDER — ACETAMINOPHEN 325 MG PO TABS
ORAL_TABLET | ORAL | Status: AC
  Filled 2016-09-08: qty 2

## 2016-09-08 MED ORDER — ACETAMINOPHEN 325 MG PO TABS
650.0000 mg | ORAL_TABLET | Freq: Once | ORAL | Status: AC
  Administered 2016-09-08: 650 mg via ORAL

## 2016-09-08 NOTE — ED Triage Notes (Signed)
Pt st's she had a sore throat yesterday.  Today woke up with generalized body aches and elevated fever. Pt also c/o productive cough, nausea and vomiting

## 2016-09-08 NOTE — ED Notes (Signed)
Pt requesting to leave Ed. Pt advised to return if condition persists or worsens. Pt in NAD and ambulated out of ED with steady gait.

## 2016-09-09 ENCOUNTER — Encounter (HOSPITAL_COMMUNITY): Payer: Self-pay | Admitting: *Deleted

## 2016-09-09 ENCOUNTER — Emergency Department (HOSPITAL_COMMUNITY)
Admission: EM | Admit: 2016-09-09 | Discharge: 2016-09-09 | Disposition: A | Discharge status: Home / Self Care | Payer: Self-pay | Attending: Emergency Medicine | Admitting: Emergency Medicine

## 2016-09-09 DIAGNOSIS — F17200 Nicotine dependence, unspecified, uncomplicated: Secondary | ICD-10-CM | POA: Insufficient documentation

## 2016-09-09 DIAGNOSIS — J069 Acute upper respiratory infection, unspecified: Secondary | ICD-10-CM | POA: Insufficient documentation

## 2016-09-09 MED ORDER — ONDANSETRON 4 MG PO TBDP
4.0000 mg | ORAL_TABLET | Freq: Three times a day (TID) | ORAL | 0 refills | Status: DC | PRN
Start: 2016-09-09 — End: 2018-12-31

## 2016-09-09 MED ORDER — IBUPROFEN 800 MG PO TABS: 800.0000 mg | ORAL_TABLET | Freq: Three times a day (TID) | ORAL | 0 refills | Status: AC

## 2016-09-09 MED ORDER — BENZONATATE 100 MG PO CAPS: 100.0000 mg | ORAL_CAPSULE | Freq: Three times a day (TID) | ORAL | 0 refills | Status: DC

## 2016-09-09 NOTE — ED Triage Notes (Signed)
Patient c/o generalized body aches and pains, cough that is productive of sputum.  Patient concerned because this morning she coughed up sputum streaked with blood.  Patient also endorses chills.  Patient denies N/V/D.  [facility], but left before being seen.  Labs performed last night indicated leukocytosis, WBC @14 , CXR was normal.

## 2016-09-09 NOTE — ED Provider Notes (Signed)
[facility]-EMERGENCY DEPT Provider Note   CSN: [id number] Arrival date & time: 09/09/16  [id number]     History   Chief Complaint Chief Complaint  Patient presents with  . Cough  . Generalized Body Aches    HPI [name] is a 30 y.o. female. She presents with a three-day illness. She has had a cough. She has had body aches. States she coughed this morning there was "a little bit of blood in it. She also going her nose for the last few years and occasionally up blood as well. She does not have chest pain but she has diffuse bodyaches. Denies pregnancy. History of tubal ligation. Nausea and vomiting 2 days ago. Mild nausea this morning but ate breakfast. She went home last night and had labs and x-ray obtained a left before evaluation because "I was in the hallway".  HPI  Past Medical History:  Diagnosis Date  . Headache(784.0)   . History of chlamydia     Patient Active Problem List   Diagnosis Date Noted  . Nausea and vomiting in pregnancy 09/08/2011    Past Surgical History:  Procedure Laterality Date  . CESAREAN SECTION    . CESAREAN SECTION  11/27/2011   Procedure: CESAREAN SECTION;  Surgeon: Kathreen CosierBernard A Marshall, MD;  Location: [facility];  Service: Gynecology;  Laterality: N/A;  . LAPAROSCOPIC TUBAL LIGATION N/A 05/03/2014   Procedure: LAPAROSCOPIC TUBAL LIGATION;  Surgeon: Kathreen CosierBernard A Marshall, MD;  Location: [facility];  Service: Gynecology;  Laterality: N/A;    OB History    Gravida Para Term Preterm AB Living   4 2 2  0 2 2   SAB TAB Ectopic Multiple Live Births   1 1 0 0 2       Home Medications    Prior to Admission medications   Medication Sig Start Date End Date Taking? Authorizing Provider  benzonatate (TESSALON) 100 MG capsule Take 1 capsule (100 mg total) by mouth every 8 (eight) hours. 09/09/16   Rolland PorterMark Janayah Zavada, MD  ibuprofen (ADVIL,MOTRIN) 800 MG tablet Take 1 tablet (800 mg total) by mouth 3 (three) times daily. 09/09/16   Rolland PorterMark Vara Mairena, MD  ondansetron (ZOFRAN ODT) 4  MG disintegrating tablet Take 1 tablet (4 mg total) by mouth every 8 (eight) hours as needed for nausea. 09/09/16   Rolland PorterMark Kendell Gammon, MD    Family History Family History  Problem Relation Age of Onset  . Hypertension Mother   . Alcohol abuse Mother   . Diabetes Mother   . Diabetes Father   . Hypertension Father   . Alcohol abuse Father   . Cancer Maternal Grandmother     Social History Social History  Substance Use Topics  . Smoking status: Current Every Day Smoker  . Smokeless tobacco: Never Used  . Alcohol use No     Allergies   Penicillins   Review of Systems Review of Systems  Constitutional: Positive for fatigue. Negative for appetite change, chills, diaphoresis and fever.  HENT: Positive for sore throat. Negative for mouth sores and trouble swallowing.   Eyes: Negative for visual disturbance.  Respiratory: Positive for cough. Negative for chest tightness, shortness of breath and wheezing.   Cardiovascular: Negative for chest pain.  Gastrointestinal: Positive for nausea. Negative for abdominal distention, abdominal pain, diarrhea and vomiting.  Endocrine: Negative for polydipsia, polyphagia and polyuria.  Genitourinary: Negative for dysuria, frequency and hematuria.  Musculoskeletal: Positive for myalgias. Negative for gait problem.  Skin: Negative for color change, pallor and rash.  Neurological: Negative for dizziness, syncope, light-headedness and headaches.  Hematological: Does not bruise/bleed easily.  Psychiatric/Behavioral: Negative for behavioral problems and confusion.     Physical Exam Updated Vital Signs BP 119/66 (BP Location: Right Arm)   Pulse 76   Temp 99.3 F (37.4 C) (Oral)   Resp 17   Ht 5\' 6"  (1.676 m)   Wt 175 lb (79.4 kg)   LMP 08/24/2016 Comment: tubal ligation - verified BEFORE imaging  SpO2 98%   BMI 28.25 kg/m   Physical Exam  Constitutional: She is oriented to person, place, and time. She appears well-developed and well-nourished.  No distress.  HENT:  Head: Normocephalic.  Eyes: Conjunctivae are normal. Pupils are equal, round, and reactive to light. No scleral icterus.  Neck: Normal range of motion. Neck supple. No thyromegaly present.  Cardiovascular: Normal rate and regular rhythm.  Exam reveals no gallop and no friction rub.   No murmur heard. Pulmonary/Chest: Effort normal and breath sounds normal. No respiratory distress. She has no wheezes. She has no rales.  Abdominal: Soft. Bowel sounds are normal. She exhibits no distension. There is no tenderness. There is no rebound.  Musculoskeletal: Normal range of motion.  Neurological: She is alert and oriented to person, place, and time.  Skin: Skin is warm and dry. No rash noted.  Psychiatric: She has a normal mood and affect. Her behavior is normal.     ED Treatments / Results  Labs (all labs ordered are listed, but only abnormal results are displayed) Labs Reviewed - No data to display  EKG  EKG Interpretation None       Radiology Dg Chest 2 View  Result Date: 09/08/2016 CLINICAL DATA:  Productive cough EXAM: CHEST  2 VIEW COMPARISON:  09/25/2010 FINDINGS: The heart size and mediastinal contours are within normal limits. Both lungs are clear. The visualized skeletal structures are unremarkable. IMPRESSION: No active cardiopulmonary disease. Electronically Signed   By: Alcide Clever M.D.   On: 09/08/2016 20:08    Procedures Procedures (including critical care time)  Medications Ordered in ED Medications - No data to display   Initial Impression / Assessment and Plan / ED Course  I have reviewed the triage vital signs and the nursing notes.  Pertinent labs & imaging results that were available during my care of the patient were reviewed by me and considered in my medical decision making (see chart for details).  Clinical Course    Exam does not suggest acute bacterial or separative localized infection. Normal ears normal throat normal lung  exam normal pharynx. Chest x-ray shows no infiltrates or effusions. She is not immunized for influenza.  She is 3 days into her symptoms. Plan will be symptomatic treatment. Motrin for body aches, rest, stay hydrated, Tessalon for cough, Zofran for nausea, primary care follow-up.  Final Clinical Impressions(s) / ED Diagnoses   Final diagnoses:  Viral upper respiratory tract infection    New Prescriptions New Prescriptions   BENZONATATE (TESSALON) 100 MG CAPSULE    Take 1 capsule (100 mg total) by mouth every 8 (eight) hours.   IBUPROFEN (ADVIL,MOTRIN) 800 MG TABLET    Take 1 tablet (800 mg total) by mouth 3 (three) times daily.   ONDANSETRON (ZOFRAN ODT) 4 MG DISINTEGRATING TABLET    Take 1 tablet (4 mg total) by mouth every 8 (eight) hours as needed for nausea.     Rolland Porter, MD 09/09/16 [phone]

## 2016-09-09 NOTE — Discharge Instructions (Signed)
Rest, push fluids, stay hydrated.  Motrin for body aches.  Tessalon for cough.  Zofran for nausea

## 2016-10-06 ENCOUNTER — Emergency Department (HOSPITAL_COMMUNITY)
Admission: EM | Admit: 2016-10-06 | Discharge: 2016-10-06 | Disposition: A | Discharge status: Home / Self Care | Payer: Self-pay | Attending: Emergency Medicine | Admitting: Emergency Medicine

## 2016-10-06 ENCOUNTER — Encounter (HOSPITAL_COMMUNITY): Payer: Self-pay | Admitting: *Deleted

## 2016-10-06 DIAGNOSIS — F17200 Nicotine dependence, unspecified, uncomplicated: Secondary | ICD-10-CM | POA: Insufficient documentation

## 2016-10-06 DIAGNOSIS — R05 Cough: Secondary | ICD-10-CM | POA: Insufficient documentation

## 2016-10-06 DIAGNOSIS — R6889 Other general symptoms and signs: Secondary | ICD-10-CM

## 2016-10-06 DIAGNOSIS — Z79899 Other long term (current) drug therapy: Secondary | ICD-10-CM | POA: Insufficient documentation

## 2016-10-06 DIAGNOSIS — M791 Myalgia: Secondary | ICD-10-CM | POA: Insufficient documentation

## 2016-10-06 LAB — URINALYSIS, ROUTINE W REFLEX MICROSCOPIC
Bilirubin Urine: NEGATIVE
GLUCOSE, UA: NEGATIVE mg/dL
Ketones, ur: NEGATIVE mg/dL
NITRITE: NEGATIVE
PH: 6 (ref 5.0–8.0)
Protein, ur: NEGATIVE mg/dL
Specific Gravity, Urine: 1.025 (ref 1.005–1.030)

## 2016-10-06 LAB — CBC
HEMATOCRIT: 38.8 % (ref 36.0–46.0)
HEMOGLOBIN: 13.1 g/dL (ref 12.0–15.0)
MCH: 29.2 pg (ref 26.0–34.0)
MCHC: 33.8 g/dL (ref 30.0–36.0)
MCV: 86.4 fL (ref 78.0–100.0)
Platelets: 216 10*3/uL (ref 150–400)
RBC: 4.49 MIL/uL (ref 3.87–5.11)
RDW: 14.5 % (ref 11.5–15.5)
WBC: 16.9 10*3/uL — ABNORMAL HIGH (ref 4.0–10.5)

## 2016-10-06 LAB — URINE MICROSCOPIC-ADD ON

## 2016-10-06 LAB — COMPREHENSIVE METABOLIC PANEL
ALT: 11 U/L — ABNORMAL LOW (ref 14–54)
ANION GAP: 6 (ref 5–15)
AST: 13 U/L — ABNORMAL LOW (ref 15–41)
Albumin: 4.2 g/dL (ref 3.5–5.0)
Alkaline Phosphatase: 54 U/L (ref 38–126)
BILIRUBIN TOTAL: 1 mg/dL (ref 0.3–1.2)
BUN: 11 mg/dL (ref 6–20)
CHLORIDE: 104 mmol/L (ref 101–111)
CO2: 27 mmol/L (ref 22–32)
Calcium: 9.1 mg/dL (ref 8.9–10.3)
Creatinine, Ser: 0.92 mg/dL (ref 0.44–1.00)
Glucose, Bld: 120 mg/dL — ABNORMAL HIGH (ref 65–99)
Potassium: 3.4 mmol/L — ABNORMAL LOW (ref 3.5–5.1)
Sodium: 137 mmol/L (ref 135–145)
TOTAL PROTEIN: 7.7 g/dL (ref 6.5–8.1)

## 2016-10-06 LAB — LIPASE, BLOOD: LIPASE: 37 U/L (ref 11–51)

## 2016-10-06 LAB — I-STAT BETA HCG BLOOD, ED (MC, WL, AP ONLY): I-stat hCG, quantitative: <5 m[IU]/mL (ref <5)

## 2016-10-06 MED ORDER — HYDROCODONE-HOMATROPINE 5-1.5 MG/5ML PO SYRP: 5.0000 mL | ORAL_SOLUTION | Freq: Four times a day (QID) | ORAL | 0 refills | Status: AC | PRN

## 2016-10-06 MED ORDER — ONDANSETRON 4 MG PO TBDP: 4.0000 mg | ORAL_TABLET | ORAL | 0 refills | Status: DC | PRN

## 2016-10-06 MED ORDER — IBUPROFEN 800 MG PO TABS: 800.0000 mg | ORAL_TABLET | Freq: Three times a day (TID) | ORAL | 0 refills | Status: AC

## 2016-10-06 NOTE — Discharge Instructions (Signed)
Your pregnancy test is negative. You may take the prescribed medications to help your symptoms. Return to the emergency department if your condition is worsening.

## 2016-10-06 NOTE — ED Triage Notes (Signed)
Pt complains of generalized body aches, emesis, and chills since yesterday. Pt states she threw up 3 times yesterday, twice yesterday.

## 2016-10-06 NOTE — ED Provider Notes (Signed)
[facility]ER[facility]PT Provider Note   CSN: [id number] Arrival date & time: 10/06/16  1422     History   Chief Complaint Chief Complaint  Patient presents with  . Generalized Body Aches  . Emesis    HPI [name] is a 30 y.o. female.  HPI Patient poor she started getting symptoms yesterday of generalized body aches and chills. She reports today she's had several episodes of vomiting. Also several episodes of diarrhea. Patient has cough. She reports she gets coughing episodes and experiences pain in her left central and upper chest. No shortness of breath. No rashes. No sore throat. Some generalized headache and for head pressure with nasal congestion. Patient reports that she had something very similar about 3 weeks ago now she feels like she "got it again". She reports she is otherwise healthy and does not have other medical problems. Past Medical History:  Diagnosis Date  . Headache(784.0)   . History of chlamydia     Patient Active Problem List   Diagnosis Date Noted  . Nausea and vomiting in pregnancy 09/08/2011    Past Surgical History:  Procedure Laterality Date  . CESAREAN SECTION    . CESAREAN SECTION  11/27/2011   Procedure: CESAREAN SECTION;  Surgeon: Kathreen CosierBernard A Marshall, MD;  Location: [facility];  Service: Gynecology;  Laterality: N/A;  . LAPAROSCOPIC TUBAL LIGATION N/A 05/03/2014   Procedure: LAPAROSCOPIC TUBAL LIGATION;  Surgeon: Kathreen CosierBernard A Marshall, MD;  Location: [facility];  Service: Gynecology;  Laterality: N/A;    OB History    Gravida Para Term Preterm AB Living   4 2 2  0 2 2   SAB TAB Ectopic Multiple Live Births   1 1 0 0 2       Home Medications    Prior to Admission medications   Medication Sig Start Date End Date Taking? Authorizing Provider  acetaminophen (TYLENOL) 500 MG tablet Take 1,000 mg by mouth every 6 (six) hours as needed (pain).   Yes Historical Provider, MD  benzonatate (TESSALON) 100 MG capsule Take 1 capsule (100 mg total) by  mouth every 8 (eight) hours. Patient not taking: Reported on 10/06/2016 09/09/16   Rolland PorterMark James, MD  HYDROcodone-homatropine [facility](HYCODAN) 5-1.5 MG/5ML syrup Take 5 mLs by mouth every 6 (six) hours as needed for cough. 10/06/16   Arby BarretteMarcy Kamaryn Grimley, MD  ibuprofen (ADVIL,MOTRIN) 800 MG tablet Take 1 tablet (800 mg total) by mouth 3 (three) times daily. Patient not taking: Reported on 10/06/2016 09/09/16   Rolland PorterMark James, MD  ibuprofen (ADVIL,MOTRIN) 800 MG tablet Take 1 tablet (800 mg total) by mouth 3 (three) times daily. 10/06/16   Arby BarretteMarcy Katelen Luepke, MD  ondansetron (ZOFRAN ODT) 4 MG disintegrating tablet Take 1 tablet (4 mg total) by mouth every 8 (eight) hours as needed for nausea. Patient not taking: Reported on 10/06/2016 09/09/16   Rolland PorterMark James, MD  ondansetron (ZOFRAN ODT) 4 MG disintegrating tablet Take 1 tablet (4 mg total) by mouth every 4 (four) hours as needed for nausea or vomiting. 10/06/16   Arby BarretteMarcy Dayami Taitt, MD    Family History Family History  Problem Relation Age of Onset  . Hypertension Mother   . Alcohol abuse Mother   . Diabetes Mother   . Diabetes Father   . Hypertension Father   . Alcohol abuse Father   . Cancer Maternal Grandmother     Social History Social History  Substance Use Topics  . Smoking status: Current Every Day Smoker  . Smokeless tobacco: Never Used  .  Alcohol use No     Allergies   Penicillins   Review of Systems Review of Systems 10 Systems reviewed and are negative for acute change except as noted in the HPI.   Physical Exam Updated Vital Signs BP 126/66 (BP Location: Right Arm)   Pulse 75   Temp 98.3 F (36.8 C) (Oral)   Resp 18   LMP 08/24/2016 Comment: tubal ligation - verified BEFORE imaging  SpO2 100%   Physical Exam  Constitutional: She is oriented to person, place, and time. She appears well-developed and well-nourished. No distress.  HENT:  Head: Normocephalic and atraumatic.  Right Ear: External ear normal.  Left Ear: External ear  normal.  Nose: Nose normal.  Mouth/Throat: Oropharynx is clear and moist.  Bilateral TMs normal no erythema or bulging. Posterior oropharynx widely patent without exudate.  Eyes: Conjunctivae and EOM are normal. Pupils are equal, round, and reactive to light.  Neck: Neck supple.  Cardiovascular: Normal rate and regular rhythm.   No murmur heard. Pulmonary/Chest: Effort normal and breath sounds normal. No respiratory distress. She has no wheezes. She has no rales.  Abdominal: Soft. There is tenderness.  Mild, diffuse lower abdominal tenderness without any guarding.  Musculoskeletal: Normal range of motion. She exhibits no edema, tenderness or deformity.  Tenderness. No erythema. No rashes.  Neurological: She is alert and oriented to person, place, and time. No cranial nerve deficit. She exhibits normal muscle tone. Coordination normal.  Skin: Skin is warm and dry. No rash noted.  Psychiatric: She has a normal mood and affect.  Nursing note and vitals reviewed.    ED Treatments / Results  Labs (all labs ordered are listed, but only abnormal results are displayed) Labs Reviewed  COMPREHENSIVE METABOLIC PANEL - Abnormal; Notable for the following:       Result Value   Potassium 3.4 (*)    Glucose, Bld 120 (*)    AST 13 (*)    ALT 11 (*)    All other components within normal limits  CBC - Abnormal; Notable for the following:    WBC 16.9 (*)    All other components within normal limits  URINALYSIS, ROUTINE W REFLEX MICROSCOPIC (NOT AT [facility]) - Abnormal; Notable for the following:    APPearance CLOUDY (*)    Hgb urine dipstick MODERATE (*)    Leukocytes, UA SMALL (*)    All other components within normal limits  URINE MICROSCOPIC-ADD ON - Abnormal; Notable for the following:    Squamous Epithelial / LPF 6-30 (*)    Bacteria, UA MANY (*)    All other components within normal limits  URINE CULTURE  LIPASE, BLOOD  I-STAT BETA HCG BLOOD, ED ([facility], [facility], [facility] ONLY)    EKG  EKG  Interpretation None       Radiology No results found.  Procedures Procedures (including critical care time)  Medications Ordered in ED Medications - No data to display   Initial Impression / Assessment and Plan / ED Course  I have reviewed the triage vital signs and the nursing notes.  Pertinent labs & imaging results that were available during my care of the patient were reviewed by me and considered in my medical decision making (see chart for details).  Clinical Course      Final Clinical Impressions(s) / ED Diagnoses   Final diagnoses:  Flu-like symptoms  Patient is clinically well in appearance. Symptoms are consistent with a viral illness. She does have cough and myalgia as well as  associated GI symptoms. Plan will be to treat symptomatically. Patient counseled on signs and symptoms were to return.  New Prescriptions New Prescriptions   HYDROCODONE-HOMATROPINE (HYCODAN) 5-1.5 MG/5ML SYRUP    Take 5 mLs by mouth every 6 (six) hours as needed for cough.   IBUPROFEN (ADVIL,MOTRIN) 800 MG TABLET    Take 1 tablet (800 mg total) by mouth 3 (three) times daily.   ONDANSETRON (ZOFRAN ODT) 4 MG DISINTEGRATING TABLET    Take 1 tablet (4 mg total) by mouth every 4 (four) hours as needed for nausea or vomiting.     Arby BarretteMarcy Lindsi Bayliss, MD 10/06/16 [phone]

## 2016-10-07 LAB — URINE CULTURE

## 2017-03-15 ENCOUNTER — Emergency Department (HOSPITAL_COMMUNITY): Payer: Self-pay

## 2017-03-15 ENCOUNTER — Emergency Department (HOSPITAL_COMMUNITY)
Admission: EM | Admit: 2017-03-15 | Discharge: 2017-03-15 | Disposition: A | Discharge status: Home / Self Care | Payer: Self-pay | Attending: Emergency Medicine | Admitting: Emergency Medicine

## 2017-03-15 ENCOUNTER — Encounter (HOSPITAL_COMMUNITY): Payer: Self-pay | Admitting: *Deleted

## 2017-03-15 DIAGNOSIS — W19XXXA Unspecified fall, initial encounter: Secondary | ICD-10-CM

## 2017-03-15 DIAGNOSIS — Y999 Unspecified external cause status: Secondary | ICD-10-CM | POA: Insufficient documentation

## 2017-03-15 DIAGNOSIS — Y9389 Activity, other specified: Secondary | ICD-10-CM | POA: Insufficient documentation

## 2017-03-15 DIAGNOSIS — Y9289 Other specified places as the place of occurrence of the external cause: Secondary | ICD-10-CM | POA: Insufficient documentation

## 2017-03-15 DIAGNOSIS — W11XXXA Fall on and from ladder, initial encounter: Secondary | ICD-10-CM | POA: Insufficient documentation

## 2017-03-15 DIAGNOSIS — S20222A Contusion of left back wall of thorax, initial encounter: Secondary | ICD-10-CM | POA: Insufficient documentation

## 2017-03-15 DIAGNOSIS — F17200 Nicotine dependence, unspecified, uncomplicated: Secondary | ICD-10-CM | POA: Insufficient documentation

## 2017-03-15 LAB — URINALYSIS, ROUTINE W REFLEX MICROSCOPIC
Bilirubin Urine: NEGATIVE
Glucose, UA: NEGATIVE mg/dL
HGB URINE DIPSTICK: NEGATIVE
Ketones, ur: NEGATIVE mg/dL
Leukocytes, UA: NEGATIVE
Nitrite: NEGATIVE
Protein, ur: NEGATIVE mg/dL
SPECIFIC GRAVITY, URINE: 1.006 (ref 1.005–1.030)
pH: 5 (ref 5.0–8.0)

## 2017-03-15 LAB — PREGNANCY, URINE: PREG TEST UR: NEGATIVE

## 2017-03-15 MED ORDER — CYCLOBENZAPRINE HCL 10 MG PO TABS
10.0000 mg | ORAL_TABLET | Freq: Once | ORAL | Status: AC
  Administered 2017-03-15: 10 mg via ORAL
  Filled 2017-03-15: qty 1

## 2017-03-15 MED ORDER — KETOROLAC TROMETHAMINE 60 MG/2ML IM SOLN
30.0000 mg | Freq: Once | INTRAMUSCULAR | Status: AC
  Administered 2017-03-15: 30 mg via INTRAMUSCULAR
  Filled 2017-03-15: qty 2

## 2017-03-15 MED ORDER — DICLOFENAC SODIUM 50 MG PO TBEC: 50.0000 mg | DELAYED_RELEASE_TABLET | Freq: Two times a day (BID) | ORAL | 0 refills | Status: AC

## 2017-03-15 MED ORDER — CYCLOBENZAPRINE HCL 10 MG PO TABS: 10.0000 mg | ORAL_TABLET | Freq: Two times a day (BID) | ORAL | 0 refills | Status: AC | PRN

## 2017-03-15 NOTE — ED Provider Notes (Signed)
[facility]-EMERGENCY DEPT Provider Note    By signing my name below, I, Earmon Phoenix, attest that this documentation has been prepared under the direction and in the presence of Benjiman Core, MD. Electronically Signed: Earmon Phoenix, ED Scribe. 03/15/17. 7:36 PM.    History   Chief Complaint Chief Complaint  Patient presents with  . Fall  . Back Pain    The history is provided by the patient and medical records. No language interpreter was used.    [name] is a 31 y.o. female who presents to the Emergency Department complaining of falling backwards from a ladder from approximately 5 feet PTA. She states she fell on her back onto some storage bins. She has not taken anything for pain. Bending, twisting and deep inspiration increase her pain. She denies alleviating factors. She denies head injury, LOC, leg pain, neck pain, numbness, tingling or weakness of the lower extremities. She is currently on her menstrual cycle.   Past Medical History:  Diagnosis Date  . Headache(784.0)   . History of chlamydia     Patient Active Problem List   Diagnosis Date Noted  . Nausea and vomiting in pregnancy 09/08/2011    Past Surgical History:  Procedure Laterality Date  . CESAREAN SECTION    . CESAREAN SECTION  11/27/2011   Procedure: CESAREAN SECTION;  Surgeon: Kathreen Cosier, MD;  Location: [facility];  Service: Gynecology;  Laterality: N/A;  . LAPAROSCOPIC TUBAL LIGATION N/A 05/03/2014   Procedure: LAPAROSCOPIC TUBAL LIGATION;  Surgeon: Kathreen Cosier, MD;  Location: [facility];  Service: Gynecology;  Laterality: N/A;    OB History    Gravida Para Term Preterm AB Living   0 2 2   SAB TAB Ectopic Multiple Live Births   1 1 0 0 2       Home Medications    Prior to Admission medications   Medication Sig Start Date End Date Taking? Authorizing Provider  acetaminophen (TYLENOL) 500 MG tablet Take 1,000 mg by mouth every 6 (six) hours as needed (pain).     Historical Provider, MD  benzonatate (TESSALON) 100 MG capsule Take 1 capsule (100 mg total) by mouth every 8 (eight) hours. Patient not taking: Reported on 10/06/2016 09/09/16   Rolland Porter, MD  HYDROcodone-homatropine [facility]) 5-1.5 MG/5ML syrup Take 5 mLs by mouth every 6 (six) hours as needed for cough. 10/06/16   Arby Barrette, MD  ibuprofen (ADVIL,MOTRIN) 800 MG tablet Take 1 tablet (800 mg total) by mouth 3 (three) times daily. Patient not taking: Reported on 10/06/2016 09/09/16   Rolland Porter, MD  ibuprofen (ADVIL,MOTRIN) 800 MG tablet Take 1 tablet (800 mg total) by mouth 3 (three) times daily. 10/06/16   Arby Barrette, MD  ondansetron (ZOFRAN ODT) 4 MG disintegrating tablet Take 1 tablet (4 mg total) by mouth every 8 (eight) hours as needed for nausea. Patient not taking: Reported on 10/06/2016 09/09/16   Rolland Porter, MD  ondansetron (ZOFRAN ODT) 4 MG disintegrating tablet Take 1 tablet (4 mg total) by mouth every 4 (four) hours as needed for nausea or vomiting. 10/06/16   Arby Barrette, MD    Family History Family History  Problem Relation Age of Onset  . Hypertension Mother   . Alcohol abuse Mother   . Diabetes Mother   . Diabetes Father   . Hypertension Father   . Alcohol abuse Father   . Cancer Maternal Grandmother     Social History Social History  Substance Use  Topics  . Smoking status: Current Every Day Smoker  . Smokeless tobacco: Never Used  . Alcohol use No     Allergies   Penicillins   Review of Systems Review of Systems  Musculoskeletal: Positive for back pain.  Neurological: Negative for syncope, weakness and numbness.     Physical Exam Updated Vital Signs BP 129/80 (BP Location: Right Arm)   Pulse 74   Temp 98.9 F (37.2 C) (Oral)   Resp 16   Ht  (1.676 m)   Wt 160 lb (72.6 kg)   LMP 03/15/2017   SpO2 100%   BMI 25.82 kg/m   Physical Exam  Constitutional: She is oriented to person, place, and time. She appears well-developed and  well-nourished.  HENT:  Head: Normocephalic and atraumatic.  Neck: Normal range of motion.  Cardiovascular: Normal rate, regular rhythm and normal heart sounds.   Pulmonary/Chest: Effort normal and breath sounds normal. No respiratory distress.  Abdominal: Soft. There is no tenderness. There is CVA tenderness.  Musculoskeletal: She exhibits tenderness. She exhibits no edema or deformity.  Tenderness to posterior left lower rib area. No ecchymosis. No crepitus. No subcutaneous emphysema. Pain with pressure to lateral chest wall. No extremity tenderness. No cervical spine tenderness. Good ROM of cervical spine and all extremities.  Neurological: She is alert and oriented to person, place, and time.  Skin: Skin is warm and dry.  Psychiatric: She has a normal mood and affect. Her behavior is normal.  Nursing note and vitals reviewed.   ED Treatments / Results  COORDINATION OF CARE: 7:18 PM- Will order imaging and in and out cath for clean urinalysis due to pt currently menstruating. Pt verbalizes understanding and agrees to plan.  Medications - No data to display  Labs (all labs ordered are listed, but only abnormal results are displayed) Labs Reviewed  URINALYSIS, ROUTINE W REFLEX MICROSCOPIC - Abnormal; Notable for the following:       Result Value   Color, Urine STRAW (*)    All other components within normal limits  PREGNANCY, URINE    EKG  EKG Interpretation  Date/Time:  Sunday March 15 2017 18:08:06 EDT Ventricular Rate:  74 PR Interval:  188 QRS Duration: 84 QT Interval:  392 QTC Calculation: 435 R Axis:   73 Text Interpretation:  Normal sinus rhythm with sinus arrhythmia Normal ECG Confirmed by Rubin Payor  MD, Harrold Donath [id number]) on 03/15/2017 7:13:04 PM       Radiology No results found.  Procedures Procedures (including critical care time)  Medications Ordered in ED Medications - No data to display   Initial Impression / Assessment and Plan / ED Course  I have  reviewed the triage vital signs and the nursing notes.  Pertinent labs & imaging results that were available during my care of the patient were reviewed by me and considered in my medical decision making (see chart for details).     Patient with fall off ladder. He landed onto back. Possible contusion. Fractures felt less likely but x-rays pending. Doubt severe intrathoracic or intra-abdominal injury. Urinalysis did not show hematuria. Likely discharge home after x-rays.  Final Clinical Impressions(s) / ED Diagnoses   Final diagnoses:  Fall, initial encounter  Contusion of left side of back, initial encounter    New Prescriptions New Prescriptions   No medications on file   I personally performed the services described in this documentation, which was scribed in my presence. The recorded information has been reviewed and is accurate.  Benjiman Core, MD 03/15/17 2025

## 2017-03-15 NOTE — Discharge Instructions (Signed)
Do not drive while taking the muscle relaxant as it will make you sleepy. °

## 2017-03-15 NOTE — ED Triage Notes (Signed)
Pt reports backwards off a step ladder today. No loc. Having pain to entire back, pain when moving her arms. Reports difficulty taking a deep breath due to pain. No acute distress is noted at triage.

## 2017-03-15 NOTE — ED Notes (Signed)
Patient transported to X-ray 

## 2017-03-15 NOTE — ED Provider Notes (Signed)
By signing my name below, I, Rosario Adie, attest that this documentation has been prepared under the direction and in the presence of [facility], [place]. Electronically Signed: Rosario Adie, ED Scribe. 03/15/17. 9:04 PM.  This is an end-of-shift handoff patient from Benjiman Core, MD. Please see original HPI for full story.   Briefly, [name] is a 31 y.o. female without pertinent PMHx, who presents to the Emergency Department complaining of sudden onset, worsening thoracic back pain beginning s/p 84ft high fall which occurred prior to arrival. She notes that she fall off of a ladder backwards onto several storage bins and her pain as been present since. No loss of consciousness or significant head injury. Pain is worse with generalized torso movements. No noted treatments were tried prior to coming into the ED. No other associated symptoms, injuries, or complaints.   On my exam, she has tenderness to the thoracic spine and muscle spasm to the left thoracic area. Tenderness is also noted to left posterior ribs. RRR. Lungs CTA. VSS.   While in the ED, pt had workup including UA, DG T-spine, and DG unilateral ribs. All were unremarkable.   Dg Ribs Unilateral W/chest Left  Result Date: 03/15/2017 CLINICAL DATA:  Patient fell off ladder.  Posterior left rib pain. EXAM: LEFT RIBS AND CHEST - 3+ VIEW COMPARISON:  CXR 09/08/2016 FINDINGS: No fracture or other bone lesions are seen involving the ribs. There is no evidence of pneumothorax or pleural effusion. Both lungs are clear. Heart size and mediastinal contours are within normal limits. IMPRESSION: No acute displaced rib fracture. Clear lungs without pneumonic consolidation, effusion or pneumothorax. Electronically Signed   By: Tollie Eth M.D.   On: 03/15/2017 20:44   Dg Thoracic Spine 2 View  Result Date: 03/15/2017 CLINICAL DATA:  Back pain after fall from ladder today EXAM: THORACIC SPINE 2 VIEWS COMPARISON:  CXR 09/08/2016  FINDINGS: There is no evidence of thoracic spine fracture. Stable slight dextroconvex curvature of the upper thoracic spine. No other significant bone abnormalities are identified. 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 No acute osseous appearing abnormality. Electronically Signed   By: Tollie Eth M.D.   On: 03/15/2017 20:45     Most recent vital signs: BP 129/80 (BP Location: Right Arm)   Pulse 74   Temp 98.9 F (37.2 C) (Oral)   Resp 16   Ht  (1.676 m)   Wt 160 lb (72.6 kg)   LMP 03/15/2017   SpO2 100%   BMI 25.82 kg/m   I informed the pt of her imaging and laboratory values. Advised her that her soreness will worsen over the next few days. Pain treated in the ED w/ Flexeril and Toradol. Will also prescribe Flexeril and Voltaren for home treatment. Conservative measures were discussed. Pt is comfortable with above plan and is stable for discharge at this time. All questions were answered prior to disposition. Strict return precautions for return into the ED were discussed. Ortho referral given.  Meds given in ED: Medications  cyclobenzaprine (FLEXERIL) tablet 10 mg (10 mg Oral Given 03/15/17 2114)  ketorolac (TORADOL) injection 30 mg (30 mg Intramuscular Given 03/15/17 2115)   New Prescriptions   No medications on file  I personally performed the services described in this documentation, which was scribed in my presence. The recorded information has been reviewed and is accurate.       [place], [place] 03/15/17 2206  Benjiman Core, MD 03/18/17 [phone]

## 2017-07-30 ENCOUNTER — Emergency Department (HOSPITAL_COMMUNITY)
Admission: EM | Admit: 2017-07-30 | Discharge: 2017-07-30 | Disposition: A | Discharge status: Home / Self Care | Payer: Self-pay | Attending: Emergency Medicine | Admitting: Emergency Medicine

## 2017-07-30 ENCOUNTER — Encounter (HOSPITAL_COMMUNITY): Payer: Self-pay | Admitting: Emergency Medicine

## 2017-07-30 DIAGNOSIS — Y9389 Activity, other specified: Secondary | ICD-10-CM | POA: Insufficient documentation

## 2017-07-30 DIAGNOSIS — Z79899 Other long term (current) drug therapy: Secondary | ICD-10-CM | POA: Insufficient documentation

## 2017-07-30 DIAGNOSIS — Y990 Civilian activity done for income or pay: Secondary | ICD-10-CM | POA: Insufficient documentation

## 2017-07-30 DIAGNOSIS — F17200 Nicotine dependence, unspecified, uncomplicated: Secondary | ICD-10-CM | POA: Insufficient documentation

## 2017-07-30 DIAGNOSIS — X500XXA Overexertion from strenuous movement or load, initial encounter: Secondary | ICD-10-CM | POA: Insufficient documentation

## 2017-07-30 DIAGNOSIS — S46812A Strain of other muscles, fascia and tendons at shoulder and upper arm level, left arm, initial encounter: Secondary | ICD-10-CM | POA: Insufficient documentation

## 2017-07-30 DIAGNOSIS — Y929 Unspecified place or not applicable: Secondary | ICD-10-CM | POA: Insufficient documentation

## 2017-07-30 MED ORDER — MELOXICAM 7.5 MG PO TABS: 7.5000 mg | ORAL_TABLET | Freq: Every day | ORAL | 0 refills | Status: AC

## 2017-07-30 MED ORDER — METHOCARBAMOL 500 MG PO TABS
500.0000 mg | ORAL_TABLET | Freq: Once | ORAL | Status: AC
  Administered 2017-07-30: 500 mg via ORAL
  Filled 2017-07-30: qty 1

## 2017-07-30 MED ORDER — METHOCARBAMOL 500 MG PO TABS: 500.0000 mg | ORAL_TABLET | Freq: Two times a day (BID) | ORAL | 0 refills | Status: AC

## 2017-07-30 MED ORDER — IBUPROFEN 200 MG PO TABS
400.0000 mg | ORAL_TABLET | Freq: Once | ORAL | Status: AC
  Administered 2017-07-30: 400 mg via ORAL
  Filled 2017-07-30: qty 2

## 2017-07-30 MED ORDER — DEXAMETHASONE SODIUM PHOSPHATE 10 MG/ML IJ SOLN
10.0000 mg | Freq: Once | INTRAMUSCULAR | Status: AC
  Administered 2017-07-30: 10 mg via INTRAMUSCULAR
  Filled 2017-07-30: qty 1

## 2017-07-30 NOTE — ED Provider Notes (Signed)
[facility]-EMERGENCY DEPT Provider Note   CSN: [id number] Arrival date & time: 07/30/17  1154     History   Chief Complaint Chief Complaint  Patient presents with  . Neck Pain    HPI [name] is a 31 y.o. female.  HPI  31 y.o. female, presents to the Emergency Department today due to left sided neck pain. No recent injuries to area. Notes pain 8/10. Described as cramping sensation. Notes lifting heavy objects at work. Worse with movement. Minimal pain at rest. Area affected starts from left side of neck to left shoulder. No numbness/tingling. No weakness. No fevers. No meds PTA. Was treating with Flexeril and Tylenol. No other symptoms noted.    Past Medical History:  Diagnosis Date  . Headache(784.0)   . History of chlamydia     Patient Active Problem List   Diagnosis Date Noted  . Nausea and vomiting in pregnancy 09/08/2011    Past Surgical History:  Procedure Laterality Date  . CESAREAN SECTION    . CESAREAN SECTION  11/27/2011   Procedure: CESAREAN SECTION;  Surgeon: Kathreen Cosier, MD;  Location: [facility] ORS;  Service: Gynecology;  Laterality: N/A;  . LAPAROSCOPIC TUBAL LIGATION N/A 05/03/2014   Procedure: LAPAROSCOPIC TUBAL LIGATION;  Surgeon: Kathreen Cosier, MD;  Location: [facility] ORS;  Service: Gynecology;  Laterality: N/A;    OB History    Gravida Para Term Preterm AB Living   0 2 2   SAB TAB Ectopic Multiple Live Births   1 1 0 0 2       Home Medications    Prior to Admission medications   Medication Sig Start Date End Date Taking? Authorizing Provider  acetaminophen (TYLENOL) 500 MG tablet Take 1,000 mg by mouth every 6 (six) hours as needed (pain).    [provider]  benzonatate (TESSALON) 100 MG capsule Take 1 capsule (100 mg total) by mouth every 8 (eight) hours. Patient not taking: Reported on 10/06/2016 09/09/16   Rolland Porter, MD  cyclobenzaprine (FLEXERIL) 10 MG tablet Take 1 tablet (10 mg total) by mouth 2 (two) times daily as  needed for muscle spasms. 03/15/17   Janne Napoleon, NP  diclofenac (VOLTAREN) 50 MG EC tablet Take 1 tablet (50 mg total) by mouth 2 (two) times daily. 03/15/17   Janne Napoleon, NP  HYDROcodone-homatropine [facility]) 5-1.5 MG/5ML syrup Take 5 mLs by mouth every 6 (six) hours as needed for cough. 10/06/16   Arby Barrette, MD  ibuprofen (ADVIL,MOTRIN) 800 MG tablet Take 1 tablet (800 mg total) by mouth 3 (three) times daily. Patient not taking: Reported on 10/06/2016 09/09/16   Rolland Porter, MD  ibuprofen (ADVIL,MOTRIN) 800 MG tablet Take 1 tablet (800 mg total) by mouth 3 (three) times daily. 10/06/16   Arby Barrette, MD  ondansetron (ZOFRAN ODT) 4 MG disintegrating tablet Take 1 tablet (4 mg total) by mouth every 8 (eight) hours as needed for nausea. Patient not taking: Reported on 10/06/2016 09/09/16   Rolland Porter, MD  ondansetron (ZOFRAN ODT) 4 MG disintegrating tablet Take 1 tablet (4 mg total) by mouth every 4 (four) hours as needed for nausea or vomiting. 10/06/16   Arby Barrette, MD    Family History Family History  Problem Relation Age of Onset  . Hypertension Mother   . Alcohol abuse Mother   . Diabetes Mother   . Diabetes Father   . Hypertension Father   . Alcohol abuse Father   . Cancer Maternal  Grandmother     Social History Social History  Substance Use Topics  . Smoking status: Current Every Day Smoker  . Smokeless tobacco: Never Used  . Alcohol use No     Allergies   Penicillins   Review of Systems Review of Systems ROS reviewed and all are negative for acute change except as noted in the HPI.  Physical Exam Updated Vital Signs BP 128/88   Pulse (!) 58   Temp 99 F (37.2 C) (Oral)   Resp 16   SpO2 98%   Physical Exam  Constitutional: She is oriented to person, place, and time. Vital signs are normal. She appears well-developed and well-nourished.  HENT:  Head: Normocephalic and atraumatic.  Right Ear: Hearing normal.  Left Ear: Hearing normal.    Eyes: Pupils are equal, round, and reactive to light. Conjunctivae and EOM are normal.  Neck: Normal range of motion. Neck supple.  Cardiovascular: Normal rate, regular rhythm, normal heart sounds and intact distal pulses.   Pulmonary/Chest: Effort normal.  Musculoskeletal: Normal range of motion.  TTP along left trapezius musculature. No midline spinous process tenderness. Left shoulder Negative hawkins test, negative Neer's test, no TTP over shoulder or elbow. No pain with flexion/extension/abduction/adduction internal or external rotation. No obvious bony deformity. NVI  Neurological: She is alert and oriented to person, place, and time.  Skin: Skin is warm and dry.  Psychiatric: She has a normal mood and affect. Her speech is normal and behavior is normal. Thought content normal.  Nursing note and vitals reviewed.    ED Treatments / Results  Labs (all labs ordered are listed, but only abnormal results are displayed) Labs Reviewed - No data to display  EKG  EKG Interpretation None       Radiology No results found.  Procedures Procedures (including critical care time)  Medications Ordered in ED Medications - No data to display   Initial Impression / Assessment and Plan / ED Course  I have reviewed the triage vital signs and the nursing notes.  Pertinent labs & imaging results that were available during my care of the patient were reviewed by me and considered in my medical decision making (see chart for details).  Final Clinical Impressions(s) / ED Diagnoses     {I have reviewed the relevant previous healthcare records.  {I obtained HPI from historian.   ED Course:  Assessment: Pt is a 31 y.o. female presents to the Emergency Department today due to left sided neck pain. No recent injuries to area. Notes pain 8/10. Described as cramping sensation. Notes lifting heavy objects at work. Worse with movement. Minimal pain at rest. Area affected starts from left side of  neck to left shoulder. No numbness/tingling. No weakness. No fevers. No meds PTA. Was treating with Flexeril and Tylenol. On exam, pt in NAD. Nontoxic/nonseptic appearing. VSS. Afebrile. TTP along left trapezius musculature. No midline spinous process tenderness. Given analgesia in ED. Plan is to DC home with motrin and muscle relaxtans. At time of discharge, Patient is in no acute distress. Vital Signs are stable. Patient is able to ambulate. Patient able to tolerate PO.   Disposition/Plan:  DC Home Additional Verbal discharge instructions given and discussed with patient.  Pt Instructed to f/u with PCP in the next week for evaluation and treatment of symptoms. Return precautions given Pt acknowledges and agrees with plan  Supervising Physician Raeford Razor, MD  Final diagnoses:  Strain of left trapezius muscle, initial encounter    New Prescriptions New  Prescriptions   No medications on file     Audry PiliMohr, Tayvia Faughnan, Cordelia Poche-C 07/30/17 1359    Raeford RazorKohut, Stephen, MD 07/31/17 [phone]

## 2017-07-30 NOTE — ED Triage Notes (Signed)
Pt reports she began to have lower back pain a week ago. Pain since then has moved to neck with radiation down L arm. No recent injuries, although she does lifts things for work. Full ROM.

## 2017-07-30 NOTE — Discharge Instructions (Signed)
Please read and follow all provided instructions.  Your diagnoses today include:  1. Strain of left trapezius muscle, initial encounter     Tests performed today include: Vital signs. See below for your results today.   Medications prescribed:  Take as prescribed   Home care instructions:  Follow any educational materials contained in this packet.  Follow-up instructions: Please follow-up with your primary care provider for further evaluation of symptoms and treatment   Return instructions:  Please return to the Emergency Department if you do not get better, if you get worse, or new symptoms OR  - Fever (temperature greater than 101.32F)  - Bleeding that does not stop with holding pressure to the area    -Severe pain (please note that you may be more sore the day after your accident)  - Chest Pain  - Difficulty breathing  - Severe nausea or vomiting  - Inability to tolerate food and liquids  - Passing out  - Skin becoming red around your wounds  - Change in mental status (confusion or lethargy)  - New numbness or weakness    Please return if you have any other emergent concerns.  Additional Information:  Your vital signs today were: BP 128/88    Pulse (!) 58    Temp 99 F (37.2 C) (Oral)    Resp 16    SpO2 98%  If your blood pressure (BP) was elevated above 135/85 this visit, please have this repeated by your doctor within one month. ---------------

## 2018-06-11 IMAGING — DX DG RIBS W/ CHEST 3+V*L*
4 series · 4 of 4 positions shown · non-contrast
Comparison: CXR 09/08/2016

CLINICAL DATA: Patient fell off ladder.  Posterior left rib pain.

EXAM:
LEFT RIBS AND CHEST - 3+ VIEW

[w chest pa]
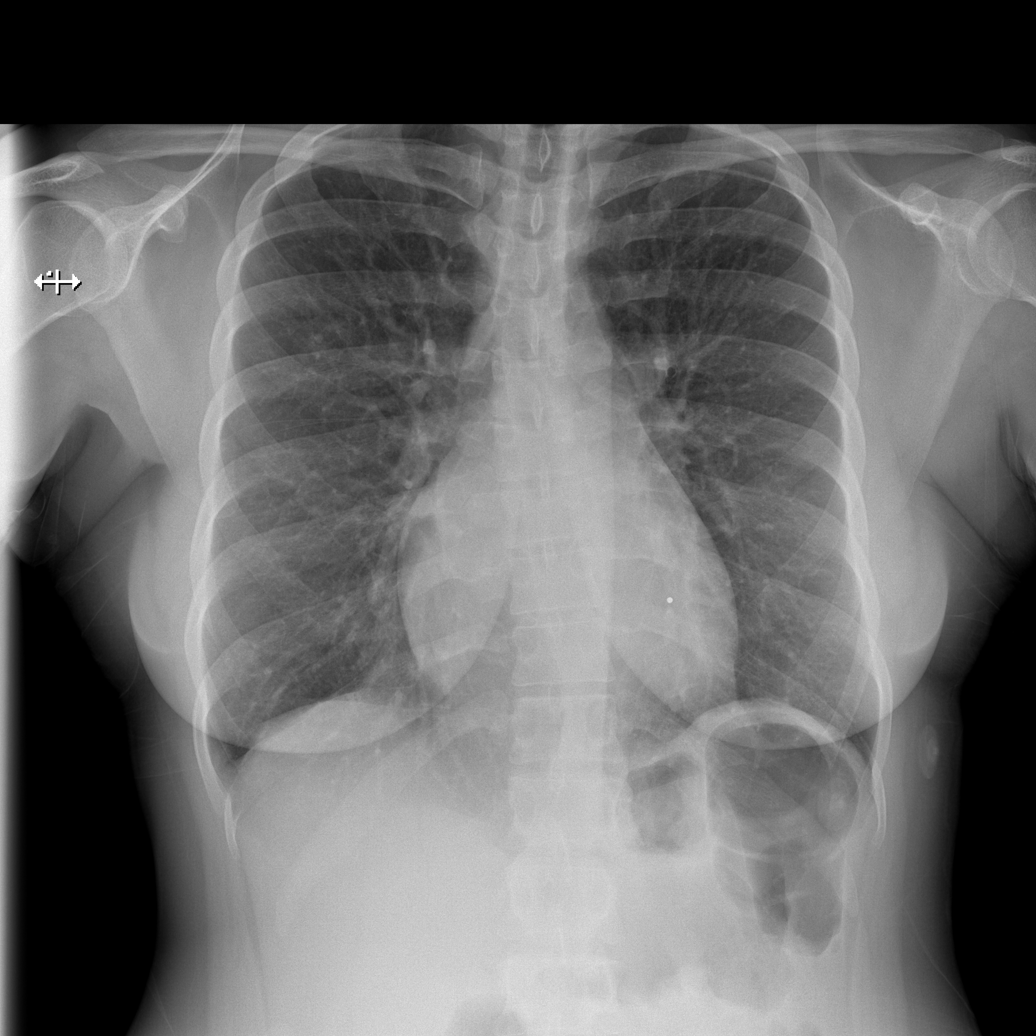

[w ribs ap upper left]
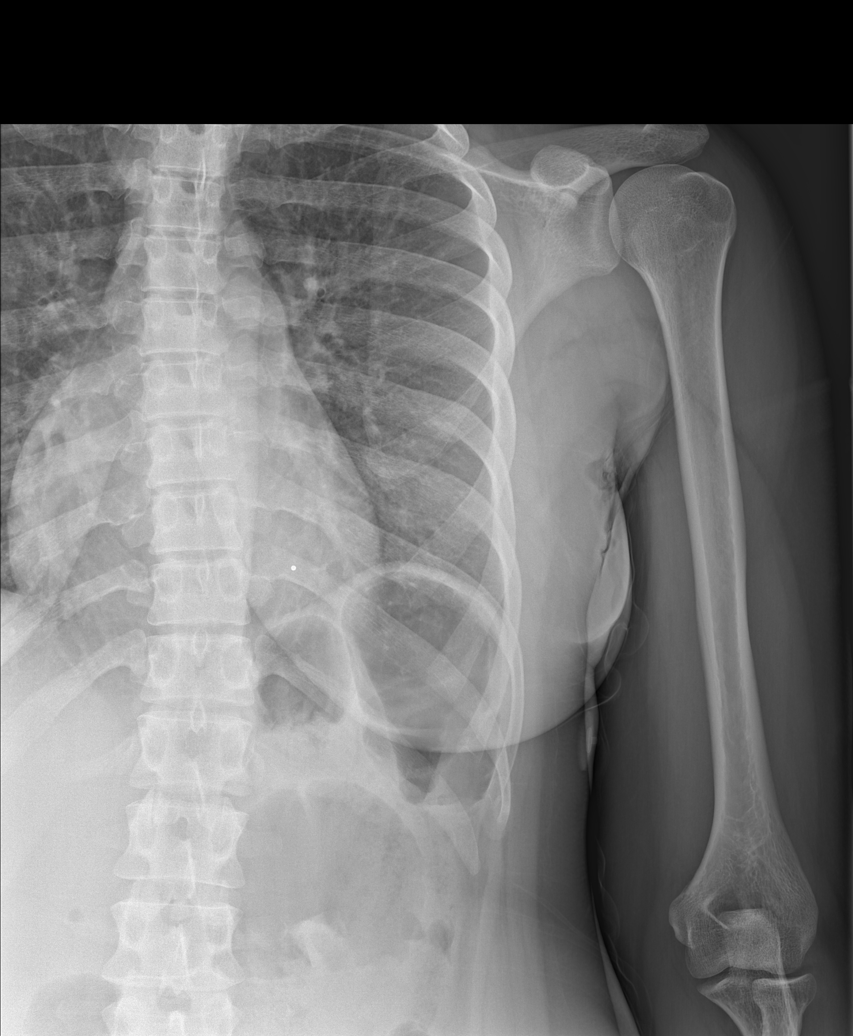

[w ribs obl left (1 of 2)]
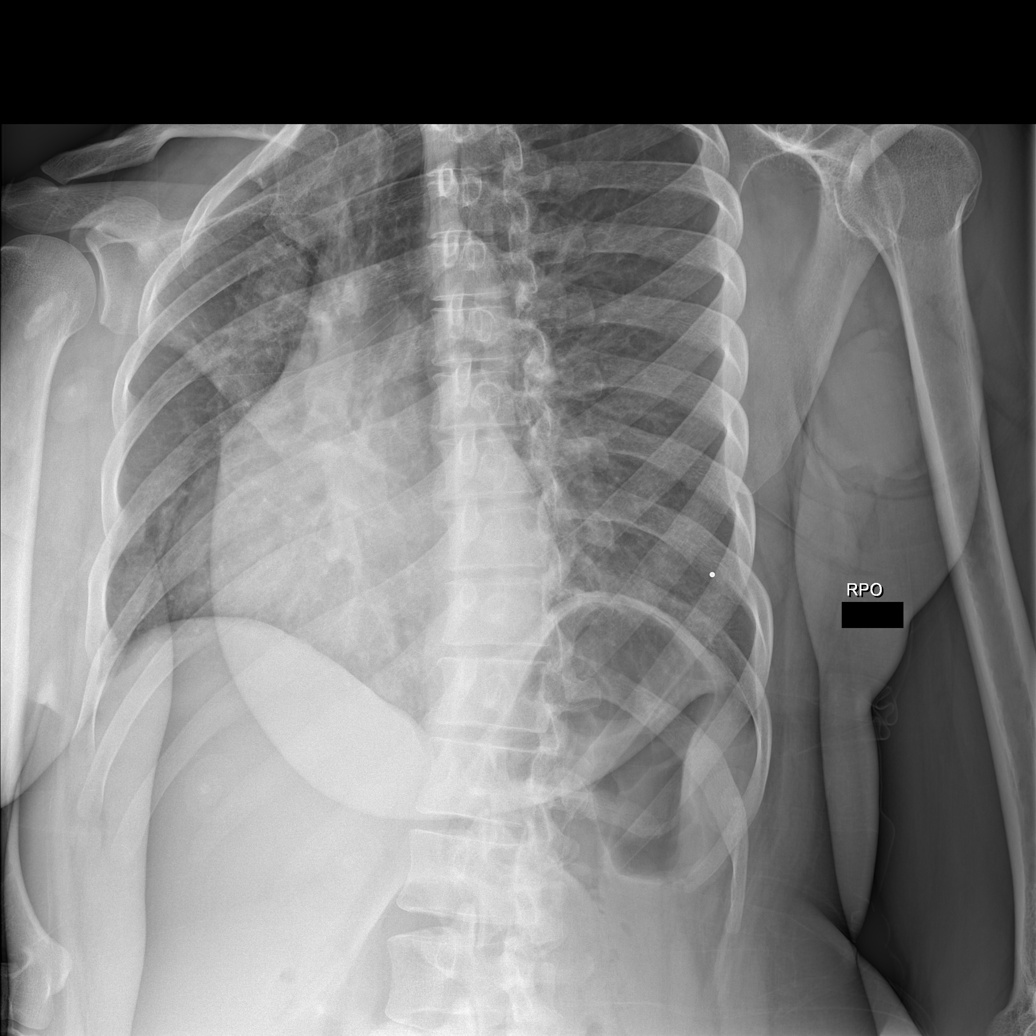

[w ribs obl left (2 of 2)]
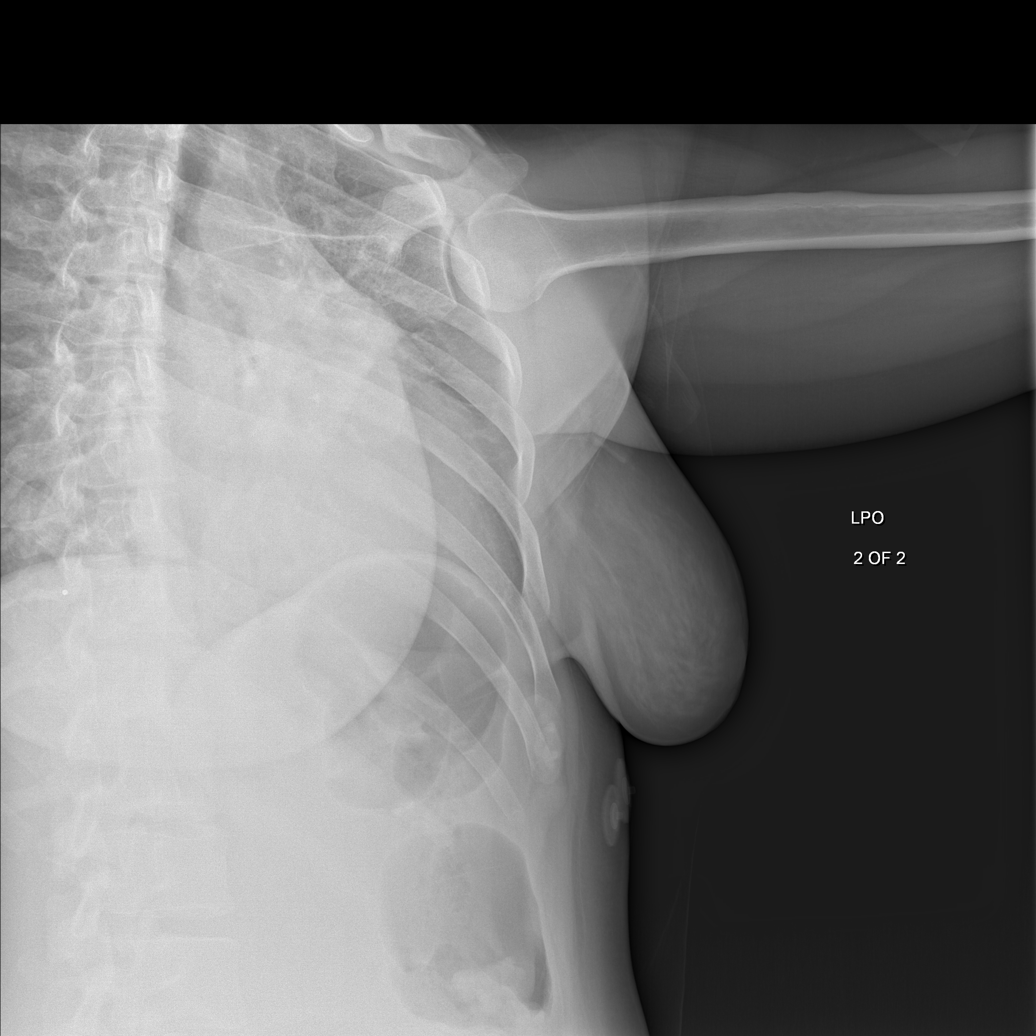

[4 of 4 positions shown; findings below may reference images not displayed]

FINDINGS: No fracture or other bone lesions are seen involving the ribs. There
is no evidence of pneumothorax or pleural effusion. Both lungs are
clear. Heart size and mediastinal contours are within normal limits.
IMPRESSION: No acute displaced rib fracture. Clear lungs without pneumonic
consolidation, effusion or pneumothorax.

## 2018-06-11 IMAGING — DX DG THORACIC SPINE 2V
3 series · 3 of 3 positions shown · non-contrast
Comparison: CXR 09/08/2016

CLINICAL DATA: Back pain after fall from ladder today

EXAM:
THORACIC SPINE 2 VIEWS

[t thoracic spine ap]
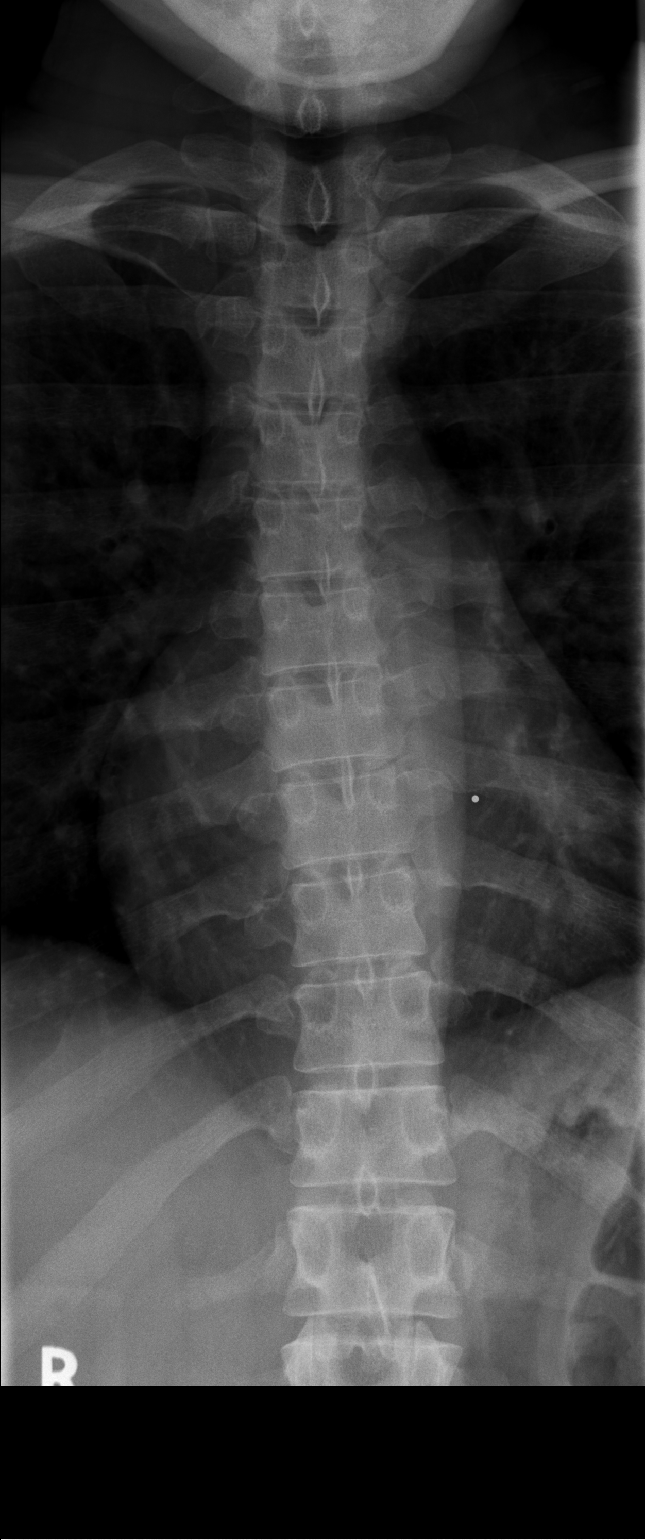

[t thoracic spine lat]
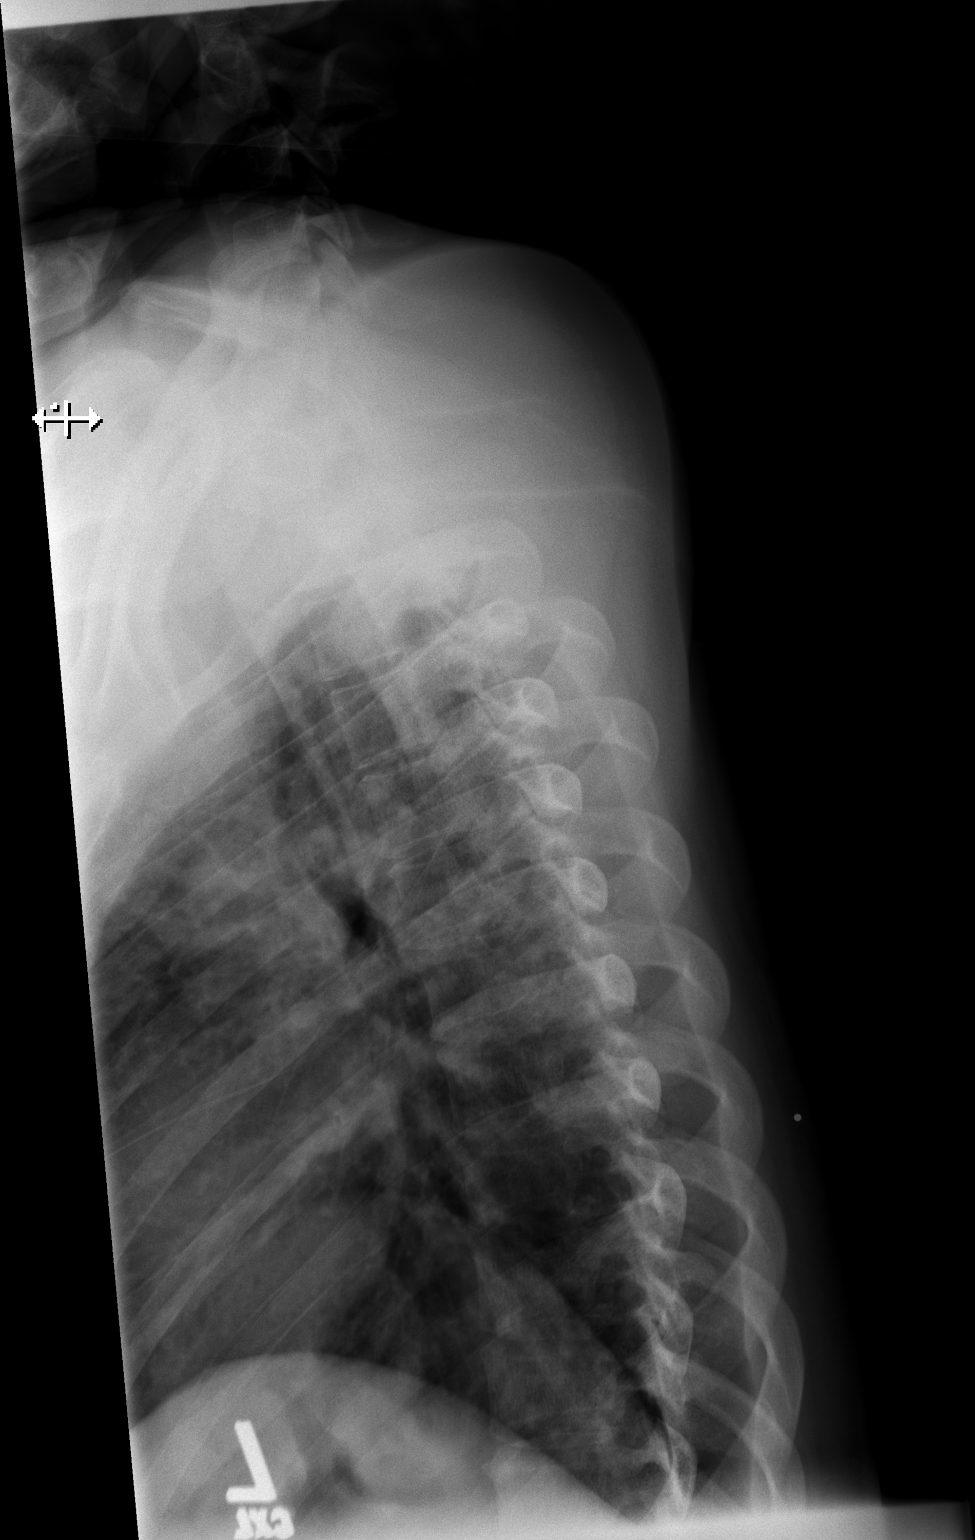

[t thoracic swimmers]
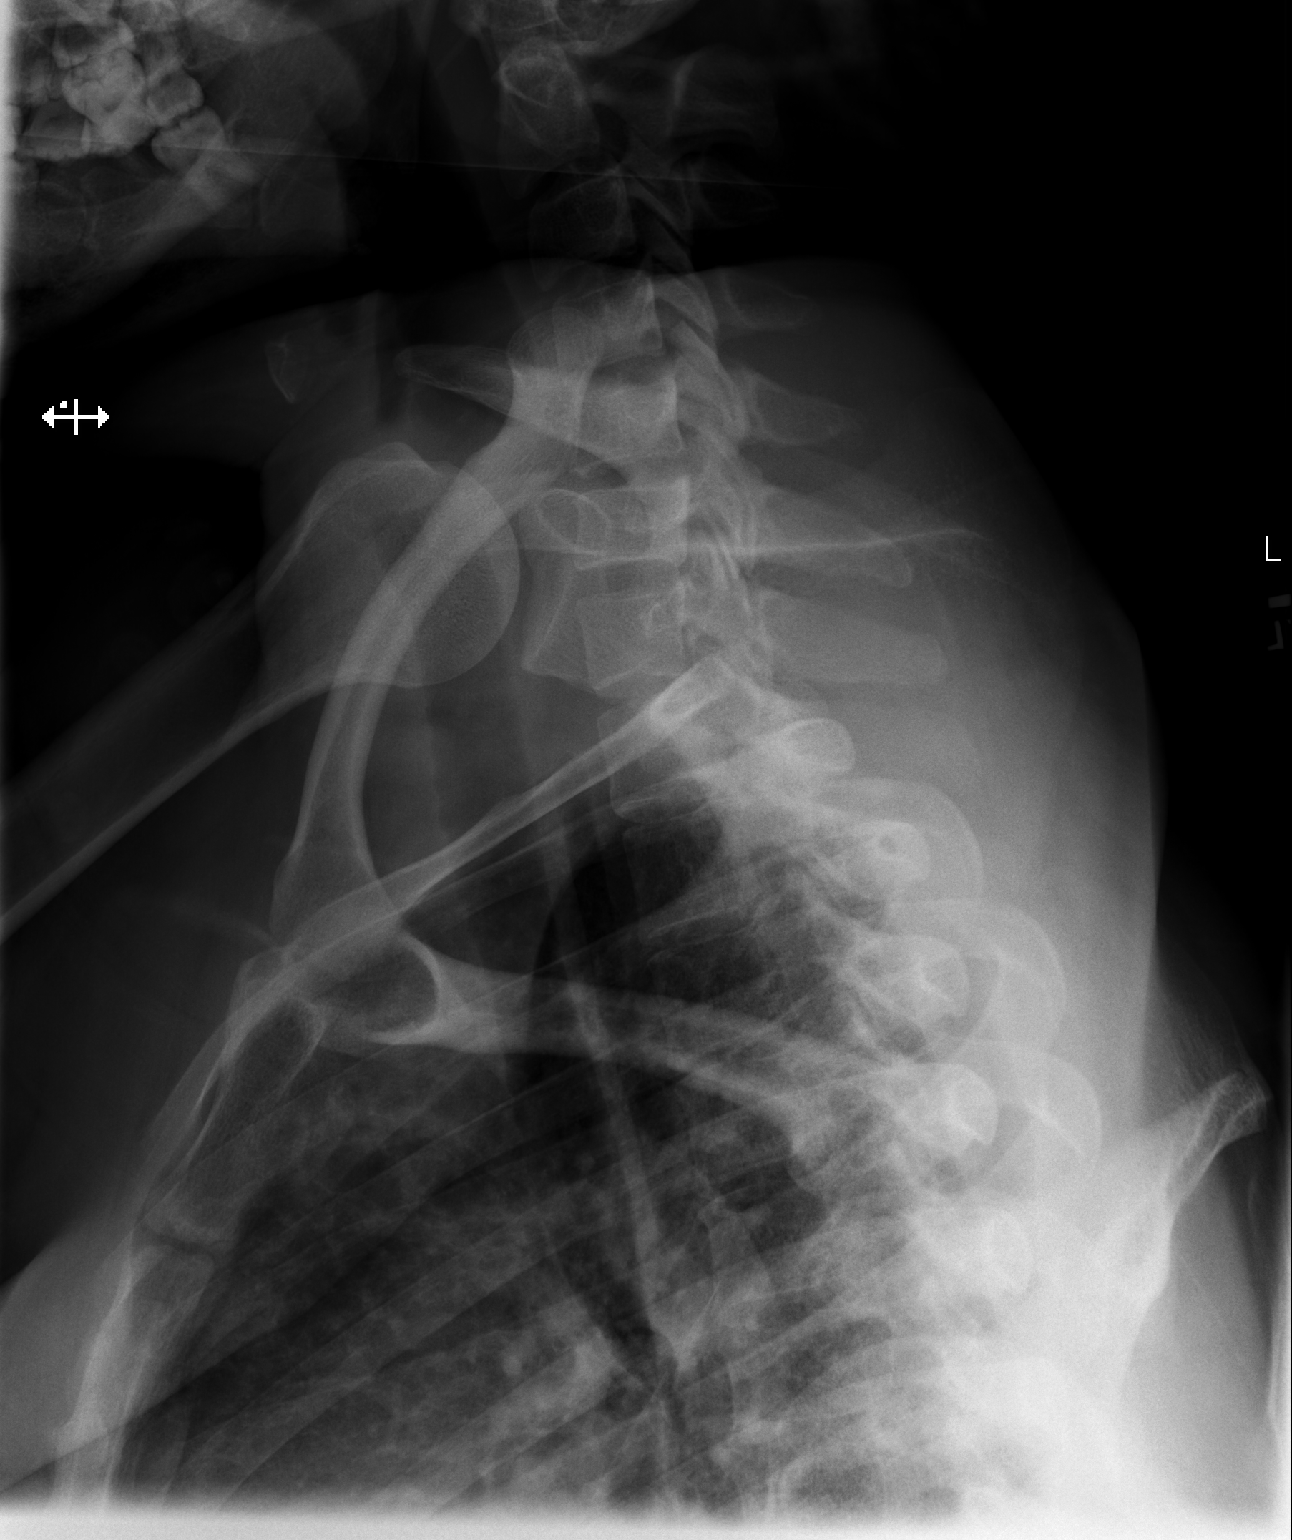

[3 of 3 positions shown; findings below may reference images not displayed]

FINDINGS: There is no evidence of thoracic spine fracture. Stable slight
dextroconvex curvature of the upper thoracic spine. No other
significant bone abnormalities are identified.
IMPRESSION: Mild dextroconvex curvature of the upper thoracic spine, stable in
appearance. No acute osseous appearing abnormality.

## 2018-12-31 ENCOUNTER — Emergency Department (HOSPITAL_COMMUNITY): Payer: Self-pay

## 2018-12-31 ENCOUNTER — Encounter (HOSPITAL_COMMUNITY): Payer: Self-pay

## 2018-12-31 ENCOUNTER — Other Ambulatory Visit: Payer: Self-pay

## 2018-12-31 ENCOUNTER — Emergency Department (HOSPITAL_COMMUNITY)
Admission: EM | Admit: 2018-12-31 | Discharge: 2019-01-01 | Disposition: A | Discharge status: Home / Self Care | Payer: Self-pay | Attending: Emergency Medicine | Admitting: Emergency Medicine

## 2018-12-31 DIAGNOSIS — J111 Influenza due to unidentified influenza virus with other respiratory manifestations: Secondary | ICD-10-CM | POA: Insufficient documentation

## 2018-12-31 DIAGNOSIS — R69 Illness, unspecified: Secondary | ICD-10-CM

## 2018-12-31 DIAGNOSIS — F17200 Nicotine dependence, unspecified, uncomplicated: Secondary | ICD-10-CM | POA: Insufficient documentation

## 2018-12-31 DIAGNOSIS — Z79899 Other long term (current) drug therapy: Secondary | ICD-10-CM | POA: Insufficient documentation

## 2018-12-31 LAB — I-STAT BETA HCG BLOOD, ED (MC, WL, AP ONLY): I-stat hCG, quantitative: <5 m[IU]/mL (ref <5)

## 2018-12-31 MED ORDER — ONDANSETRON 4 MG PO TBDP: 4.0000 mg | ORAL_TABLET | Freq: Three times a day (TID) | ORAL | 0 refills | Status: AC | PRN

## 2018-12-31 MED ORDER — ONDANSETRON HCL 4 MG/2ML IJ SOLN
4.0000 mg | Freq: Once | INTRAMUSCULAR | Status: AC
  Administered 2018-12-31: 4 mg via INTRAVENOUS
  Filled 2018-12-31: qty 2

## 2018-12-31 MED ORDER — BENZONATATE 100 MG PO CAPS: 100.0000 mg | ORAL_CAPSULE | Freq: Three times a day (TID) | ORAL | 0 refills | Status: DC

## 2018-12-31 MED ORDER — FLUTICASONE PROPIONATE 50 MCG/ACT NA SUSP: 2.0000 | Freq: Every day | NASAL | 0 refills | Status: AC

## 2018-12-31 MED ORDER — ACETAMINOPHEN 325 MG PO TABS
650.0000 mg | ORAL_TABLET | Freq: Once | ORAL | Status: AC | PRN
  Administered 2018-12-31: 650 mg via ORAL
  Filled 2018-12-31: qty 2

## 2018-12-31 MED ORDER — SODIUM CHLORIDE 0.9 % IV BOLUS
1000.0000 mL | Freq: Once | INTRAVENOUS | Status: AC
  Administered 2018-12-31: 1000 mL via INTRAVENOUS

## 2018-12-31 NOTE — Discharge Instructions (Signed)
Drink plenty of fluids and get plenty of rest.  Take flonase to decrease nasal congestion.  Tessalon as needed for cough.  Take Zofran as needed for nausea and vomiting.  Let this medicine dissolve under your tongue and wait around 10 to 15 minutes before you have anything to eat or drink to give this medicine time to work.  Alternate 600 mg of ibuprofen and [phone] mg of Tylenol every 3 hours as needed for pain/fever. Do not exceed 4000 mg of Tylenol daily.  Take ibuprofen with food to avoid upset stomach issues.   Followup with your primary care doctor in 5-7 days for recheck of ongoing symptoms. Return to emergency department for emergent changing or worsening of symptoms such as throat tightness, facial swelling, fever not controlled by ibuprofen or Tylenol,difficulty breathing, abdominal pain, or chest pain.

## 2018-12-31 NOTE — ED Triage Notes (Signed)
Pt reports nausea, vomiting, generalized body aches, fever, and malaise x4 days. A&Ox4. Reports T max at home was 106.

## 2018-12-31 NOTE — ED Notes (Signed)
Pt is eating a ham and cheese sandwich and drinking water

## 2018-12-31 NOTE — ED Provider Notes (Signed)
[facility]-EMERGENCY DEPT Provider Note   CSN: [id number] Arrival date & time: 12/31/18  2032     History   Chief Complaint Chief Complaint  Patient presents with  . Fever  . Generalized Body Aches    HPI [name] is a 33 y.o. female presents for evaluation of acute onset, progressively worsening flulike symptoms for 1 week.  Reports generalized body aches, nasal congestion, sinus pressure, and dry cough.  Denies shortness of breath, notes chest achiness with cough only.  Developed nausea and vomiting yesterday.  Has had approximately 3 episodes of nonbloody nonbilious emesis, reports she has been able to tolerate orange juice.  Denies abdominal pain, diarrhea, constipation, melena, hematochezia, or urinary symptoms.  Has been taking Tamiflu, Alka-Seltzer, Tylenol with some improvement.  Has been febrile at home.  Works [employer] and comes in contact with many people but is unsure of any specific sick contacts.  The history is provided by the patient.    Past Medical History:  Diagnosis Date  . Headache(784.0)   . History of chlamydia     Patient Active Problem List   Diagnosis Date Noted  . Nausea and vomiting in pregnancy 09/08/2011    Past Surgical History:  Procedure Laterality Date  . CESAREAN SECTION    . CESAREAN SECTION  11/27/2011   Procedure: CESAREAN SECTION;  Surgeon: Kathreen CosierBernard A Marshall, MD;  Location: [facility] ORS;  Service: Gynecology;  Laterality: N/A;  . LAPAROSCOPIC TUBAL LIGATION N/A 05/03/2014   Procedure: LAPAROSCOPIC TUBAL LIGATION;  Surgeon: Kathreen CosierBernard A Marshall, MD;  Location: [facility] ORS;  Service: Gynecology;  Laterality: N/A;     OB History    Gravida  4   Para  2   Term  2   Preterm  0   AB  2   Living  2     SAB  1   TAB  1   Ectopic  0   Multiple  0   Live Births  2            Home Medications    Prior to Admission medications   Medication Sig Start Date End Date Taking? Authorizing Provider    acetaminophen (TYLENOL) 500 MG tablet Take 1,000 mg by mouth every 6 (six) hours as needed (pain).    [provider]  benzonatate (TESSALON) 100 MG capsule Take 1 capsule (100 mg total) by mouth every 8 (eight) hours. 12/31/18   Luevenia MaxinFawze, Xylina Rhoads A, PA-C  cyclobenzaprine (FLEXERIL) 10 MG tablet Take 1 tablet (10 mg total) by mouth 2 (two) times daily as needed for muscle spasms. 03/15/17   Janne NapoleonNeese, Hope M, NP  diclofenac (VOLTAREN) 50 MG EC tablet Take 1 tablet (50 mg total) by mouth 2 (two) times daily. 03/15/17   Janne NapoleonNeese, Hope M, NP  fluticasone (FLONASE) 50 MCG/ACT nasal spray Place 2 sprays into both nostrils daily. 12/31/18   Roald Lukacs A, PA-C  HYDROcodone-homatropine (HYCODAN) 5-1.5 MG/5ML syrup Take 5 mLs by mouth every 6 (six) hours as needed for cough. 10/06/16   Arby BarrettePfeiffer, Marcy, MD  ibuprofen (ADVIL,MOTRIN) 800 MG tablet Take 1 tablet (800 mg total) by mouth 3 (three) times daily. Patient not taking: Reported on 10/06/2016 09/09/16   Rolland PorterJames, Mark, MD  ibuprofen (ADVIL,MOTRIN) 800 MG tablet Take 1 tablet (800 mg total) by mouth 3 (three) times daily. 10/06/16   Arby BarrettePfeiffer, Marcy, MD  meloxicam (MOBIC) 7.5 MG tablet Take 1 tablet (7.5 mg total) by mouth daily. 07/30/17  Audry Pili, PA-C  methocarbamol (ROBAXIN) 500 MG tablet Take 1 tablet (500 mg total) by mouth 2 (two) times daily. 07/30/17   Audry Pili, PA-C  ondansetron (ZOFRAN ODT) 4 MG disintegrating tablet Take 1 tablet (4 mg total) by mouth every 8 (eight) hours as needed for nausea or vomiting. 12/31/18   Jeanie Sewer, PA-C    Family History Family History  Problem Relation Age of Onset  . Hypertension Mother   . Alcohol abuse Mother   . Diabetes Mother   . Diabetes Father   . Hypertension Father   . Alcohol abuse Father   . Cancer Maternal Grandmother     Social History Social History   Tobacco Use  . Smoking status: Current Every Day Smoker  . Smokeless tobacco: Never Used  Substance Use Topics  . Alcohol use: No  . Drug  use: Yes    Types: Marijuana     Allergies   Penicillins   Review of Systems Review of Systems  Constitutional: Positive for chills and fever.  HENT: Positive for congestion and sinus pressure. Negative for sore throat.   Respiratory: Positive for cough. Negative for shortness of breath.   Cardiovascular: Negative for chest pain (with cough only).  Gastrointestinal: Positive for nausea and vomiting. Negative for abdominal pain, blood in stool, constipation and diarrhea.  Musculoskeletal: Negative for neck pain and neck stiffness.  All other systems reviewed and are negative.    Physical Exam Updated Vital Signs BP 130/87 (BP Location: Right Arm)   Pulse 96   Temp 99.8 F (37.7 C) (Oral)   Resp 20   Ht 5\' 6"  (1.676 m)   LMP 12/24/2018   SpO2 98%   BMI 25.82 kg/m   Physical Exam Vitals signs and nursing note reviewed.  Constitutional:      General: She is not in acute distress.    Appearance: She is well-developed.  HENT:     Head: Normocephalic and atraumatic.     Right Ear: Tympanic membrane, ear canal and external ear normal.     Left Ear: Tympanic membrane, ear canal and external ear normal.     Nose: Congestion and rhinorrhea present.     Mouth/Throat:     Mouth: Mucous membranes are moist.     Pharynx: Oropharynx is clear. No oropharyngeal exudate or posterior oropharyngeal erythema.  Eyes:     General:        Right eye: No discharge.        Left eye: No discharge.     Conjunctiva/sclera: Conjunctivae normal.  Neck:     Musculoskeletal: Normal range of motion and neck supple. No neck rigidity.     Vascular: No JVD.     Trachea: No tracheal deviation.  Cardiovascular:     Rate and Rhythm: Normal rate and regular rhythm.     Heart sounds: Normal heart sounds.  Pulmonary:     Effort: Pulmonary effort is normal.     Breath sounds: Normal breath sounds.  Abdominal:     General: Abdomen is flat. There is no distension.     Tenderness: There is no  abdominal tenderness. There is no guarding or rebound.  Skin:    General: Skin is warm and dry.     Findings: No erythema.  Neurological:     Mental Status: She is alert.  Psychiatric:        Behavior: Behavior normal.      ED Treatments / Results  Labs (all labs ordered  are listed, but only abnormal results are displayed) Labs Reviewed  I-STAT BETA HCG BLOOD, ED ([facility], [facility], [facility] ONLY)    EKG None  Radiology Dg Chest 2 View  Result Date: 12/31/2018 CLINICAL DATA:  Nausea, vomiting and body aches. EXAM: CHEST - 2 VIEW COMPARISON:  Chest radiograph March 15, 2017. FINDINGS: Cardiomediastinal silhouette is normal. No pleural effusions or focal consolidations. Trachea projects midline and there is no pneumothorax. Soft tissue planes and included osseous structures are non-suspicious. IMPRESSION: Negative. Electronically Signed   By: Awilda Metro M.D.   On: 12/31/2018 21:55    Procedures Procedures (including critical care time)  Medications Ordered in ED Medications  acetaminophen (TYLENOL) tablet 650 mg (650 mg Oral Given 12/31/18 2052)  sodium chloride 0.9 % bolus 1,000 mL (0 mLs Intravenous Stopped 01/01/19 0023)  ondansetron (ZOFRAN) injection 4 mg (4 mg Intravenous Given 12/31/18 2300)     Initial Impression / Assessment and Plan / ED Course  I have reviewed the triage vital signs and the nursing notes.  Pertinent labs & imaging results that were available during my care of the patient were reviewed by me and considered in my medical decision making (see chart for details).     Patient presenting for evaluation of flulike symptoms for 1 week, developed nausea and vomiting yesterday.  Low-grade fever on presentation, improved with Tylenol.  Remainder of vital signs are stable.  She appears well-hydrated overall.  Benign abdominal examination.  Doubt acute surgical abdominal pathology or serious intra-abdominal infection.  X-ray shows no evidence of pneumonia or pleural  effusion.  She was given fluids and Zofran, on reassessment she is tolerating p.o. fluids without difficulty.  Serial abdominal examinations remain benign. She feels comfortable with discharge home.  Discussed that since her symptoms have been ongoing for longer than 3 days she is not a candidate for Tamiflu even if she did test positive for influenza.  We discussed that antibiotics are not indicated presently.  Will discharge with symptomatic management.  Discussed strict ED return precautions.  Patient and her significant other verbalized understanding of and agreement with plan and patient is stable for discharge home at this time. Final Clinical Impressions(s) / ED Diagnoses   Final diagnoses:  Influenza-like illness    ED Discharge Orders         Ordered    benzonatate (TESSALON) 100 MG capsule  Every 8 hours     12/31/18 2351    fluticasone (FLONASE) 50 MCG/ACT nasal spray  Daily     12/31/18 2351    ondansetron (ZOFRAN ODT) 4 MG disintegrating tablet  Every 8 hours PRN     12/31/18 2351           Jeanie Sewer, PA-C 01/01/19 1511    Benjiman Core, MD 01/03/19 [phone]

## 2019-01-01 NOTE — ED Notes (Signed)
Pt is alert x4 d/c instructions given with tech back, belonging given to pt, her family and ride is at her bedside.

## 2019-05-30 ENCOUNTER — Emergency Department (HOSPITAL_COMMUNITY)
Admission: EM | Admit: 2019-05-30 | Discharge: 2019-05-30 | Disposition: A | Discharge status: Home / Self Care | Payer: Self-pay | Attending: Emergency Medicine | Admitting: Emergency Medicine

## 2019-05-30 ENCOUNTER — Other Ambulatory Visit: Payer: Self-pay

## 2019-05-30 ENCOUNTER — Encounter (HOSPITAL_COMMUNITY): Payer: Self-pay

## 2019-05-30 DIAGNOSIS — F17210 Nicotine dependence, cigarettes, uncomplicated: Secondary | ICD-10-CM | POA: Insufficient documentation

## 2019-05-30 DIAGNOSIS — M940 Chondrocostal junction syndrome [Tietze]: Secondary | ICD-10-CM | POA: Insufficient documentation

## 2019-05-30 DIAGNOSIS — R079 Chest pain, unspecified: Secondary | ICD-10-CM

## 2019-05-30 MED ORDER — NAPROXEN 500 MG PO TABS: 500.0000 mg | ORAL_TABLET | Freq: Two times a day (BID) | ORAL | 0 refills | Status: AC

## 2019-05-30 NOTE — Discharge Instructions (Signed)
You were evaluated in the Emergency Department and after careful evaluation, we did not find any emergent condition requiring admission or further testing in the hospital.  Your symptoms today seem to be due to inflammation of the muscles and/or joints of the chest wall.  Please use the anti-inflammatory provided as needed for pain.  Please return to the Emergency Department if you experience any worsening of your condition.  We encourage you to follow up with a primary care provider.  Thank you for allowing [place] to be a part of your care.

## 2019-05-30 NOTE — ED Provider Notes (Signed)
[facility] Emergency Department Provider Note MRN:  [id number]  Arrival date & time: 05/30/19     Chief Complaint   Chest Pain   History of Present Illness   [name] is a 33 y.o. year-old female with no pertinent past medical history presenting to the ED with chief complaint of chest pain.  Pain is located in the center of the chest, began gradually.  Patient explains that she works as a Freight forwarder at [employer] and has had to [employer] for the past few days.  Pain is worse with palpation of the chest.  She had to do quite a bit of stocking today and the pain became worse.  Pain is worse with movement.  Denies dizziness, no diaphoresis, no nausea, no vomiting, no shortness of breath.  No leg pain or swelling.  No drug use.  Review of Systems  A complete 10 system review of systems was obtained and all systems are negative except as noted in the HPI and PMH.   Patient's Health History    Past Medical History:  Diagnosis Date  . Headache(784.0)   . History of chlamydia     Past Surgical History:  Procedure Laterality Date  . CESAREAN SECTION    . CESAREAN SECTION  11/27/2011   Procedure: CESAREAN SECTION;  Surgeon: Frederico Hamman, MD;  Location: [place] [facility];  Service: Gynecology;  Laterality: N/A;  . LAPAROSCOPIC TUBAL LIGATION N/A 05/03/2014   Procedure: LAPAROSCOPIC TUBAL LIGATION;  Surgeon: Frederico Hamman, MD;  Location: [place] [facility];  Service: Gynecology;  Laterality: N/A;    Family History  Problem Relation Age of Onset  . Hypertension Mother   . Alcohol abuse Mother   . Diabetes Mother   . Diabetes Father   . Hypertension Father   . Alcohol abuse Father   . Cancer Maternal Grandmother     Social History   Socioeconomic History  . Marital status: Single    Spouse name: Not on file  . Number of children: Not on file  . Years of education: Not on file  . Highest education level: Not on file  Occupational History  . Not on file  Social Needs   . Financial resource strain: Not on file  . Food insecurity    Worry: Not on file    Inability: Not on file  . Transportation needs    Medical: Not on file    Non-medical: Not on file  Tobacco Use  . Smoking status: Current Every Day Smoker    Packs/day: 0.50    Types: Cigarettes  . Smokeless tobacco: Never Used  Substance and Sexual Activity  . Alcohol use: No  . Drug use: Yes    Types: Marijuana  . Sexual activity: Not Currently    Birth control/protection: Injection  Lifestyle  . Physical activity    Days per week: Not on file    Minutes per session: Not on file  . Stress: Not on file  Relationships  . Social Herbalist on phone: Not on file    Gets together: Not on file    Attends religious service: Not on file    Active member of club or organization: Not on file    Attends meetings of clubs or organizations: Not on file    Relationship status: Not on file  . Intimate partner violence    Fear of current or ex partner: Not on file    Emotionally abused: Not on  file    Physically abused: Not on file    Forced sexual activity: Not on file  Other Topics Concern  . Not on file  Social History Narrative  . Not on file     Physical Exam  Vital Signs and Nursing Notes reviewed Vitals:   05/30/19 1520  BP: 124/76  Pulse: 80  Resp: 16  Temp: 98.5 F (36.9 C)  SpO2: 98%    CONSTITUTIONAL: Well-appearing, NAD NEURO:  Alert and oriented x 3, no focal deficits EYES:  eyes equal and reactive ENT/NECK:  no LAD, no JVD CARDIO: Regular rate, well-perfused, normal S1 and S2; reproducible pain with palpation to the central chest PULM:  CTAB no wheezing or rhonchi GI/GU:  normal bowel sounds, non-distended, non-tender MSK/SPINE:  No gross deformities, no edema SKIN:  no rash, atraumatic PSYCH:  Appropriate speech and behavior  Diagnostic and Interventional Summary    Labs Reviewed - No data to display  No orders to display    Medications - No data to  display   Procedures Critical Care  ED Course and Medical Decision Making  I have reviewed the triage vital signs and the nursing notes.  Pertinent labs & imaging results that were available during my care of the patient were reviewed by me and considered in my medical decision making (see below for details).  Consistent with costochondritis, normal vital signs, PERC negative, little to no risk factors for cardiac etiology, clear and present lung sounds in all fields, doubt pneumothorax.  Discussed options with patient, who preferred a limited work-up without expensive testing.  X-ray and EKG deferred at this time.  Appropriate for discharge with strict return precautions.  After the discussed management above, the patient was determined to be safe for discharge.  The patient was in agreement with this plan and all questions regarding their care were answered.  ED return precautions were discussed and the patient will return to the ED with any significant worsening of condition.  Elmer SowMichael M. Pilar PlateBero, MD [facility] [email or web address]  Final Clinical Impressions(s) / ED Diagnoses     ICD-10-CM   1. Chest pain, unspecified type  R07.9   2. Costochondritis, acute  M94.0     ED Discharge Orders         Ordered    naproxen (NAPROSYN) 500 MG tablet  2 times daily     05/30/19 1638             Sabas SousBero, Aunica Dauphinee M, MD 05/30/19 1643

## 2019-05-30 NOTE — ED Triage Notes (Signed)
Patient c/o mid and left chest pain and states the area is sore to touch. 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

## 2020-08-02 ENCOUNTER — Encounter (HOSPITAL_COMMUNITY): Payer: Self-pay | Admitting: Emergency Medicine

## 2020-08-02 ENCOUNTER — Other Ambulatory Visit: Payer: Self-pay

## 2020-08-02 ENCOUNTER — Emergency Department (HOSPITAL_COMMUNITY)
Admission: EM | Admit: 2020-08-02 | Discharge: 2020-08-02 | Disposition: A | Discharge status: Home / Self Care | Payer: HRSA Program | Attending: Emergency Medicine | Admitting: Emergency Medicine

## 2020-08-02 DIAGNOSIS — Z79899 Other long term (current) drug therapy: Secondary | ICD-10-CM | POA: Insufficient documentation

## 2020-08-02 DIAGNOSIS — Z20822 Contact with and (suspected) exposure to covid-19: Secondary | ICD-10-CM | POA: Insufficient documentation

## 2020-08-02 DIAGNOSIS — J029 Acute pharyngitis, unspecified: Secondary | ICD-10-CM | POA: Diagnosis present

## 2020-08-02 DIAGNOSIS — R519 Headache, unspecified: Secondary | ICD-10-CM | POA: Diagnosis not present

## 2020-08-02 DIAGNOSIS — F17210 Nicotine dependence, cigarettes, uncomplicated: Secondary | ICD-10-CM | POA: Insufficient documentation

## 2020-08-02 LAB — SARS CORONAVIRUS 2 (TAT 6-24 HRS): SARS Coronavirus 2: NEGATIVE

## 2020-08-02 NOTE — ED Provider Notes (Signed)
[facility]-EMERGENCY DEPT Provider Note   CSN: [id number] Arrival date & time: 08/02/20  1206     History Chief Complaint  Patient presents with  . Covid Exposure    [name] is a 34 y.o. female.  34 yo female with sore throat and HA off an on for a few days, exposed to someone 3 days ago who tested positive for COVID.  No headache at this time, no other complaints.  [name] was evaluated in Emergency Department on 08/02/2020 for the symptoms described in the history of present illness. She was evaluated in the context of the global COVID-19 pandemic, which necessitated consideration that the patient might be at risk for infection with the SARS-CoV-2 virus that causes COVID-19. Institutional protocols and algorithms that pertain to the evaluation of patients at risk for COVID-19 are in a state of rapid change based on information released by regulatory bodies including the CDC and federal and state organizations. These policies and algorithms were followed during the patient's care in the ED.         Past Medical History:  Diagnosis Date  . Headache(784.0)   . History of chlamydia     Patient Active Problem List   Diagnosis Date Noted  . Nausea and vomiting in pregnancy 09/08/2011    Past Surgical History:  Procedure Laterality Date  . CESAREAN SECTION    . CESAREAN SECTION  11/27/2011   Procedure: CESAREAN SECTION;  Surgeon: Kathreen Cosier, MD;  Location: [facility];  Service: Gynecology;  Laterality: N/A;  . LAPAROSCOPIC TUBAL LIGATION N/A 05/03/2014   Procedure: LAPAROSCOPIC TUBAL LIGATION;  Surgeon: Kathreen Cosier, MD;  Location: [facility];  Service: Gynecology;  Laterality: N/A;     OB History    Gravida  4   Para  2   Term  2   Preterm  0   AB  2   Living  2     SAB  1   TAB  1   Ectopic  0   Multiple  0   Live Births  2           Family History  Problem Relation Age of Onset  . Hypertension Mother     . Alcohol abuse Mother   . Diabetes Mother   . Diabetes Father   . Hypertension Father   . Alcohol abuse Father   . Cancer Maternal Grandmother     Social History   Tobacco Use  . Smoking status: Current Every Day Smoker    Packs/day: 0.50    Types: Cigarettes  . Smokeless tobacco: Never Used  Vaping Use  . Vaping Use: Never used  Substance Use Topics  . Alcohol use: No  . Drug use: Yes    Types: Marijuana    Home Medications Prior to Admission medications   Medication Sig Start Date End Date Taking? Authorizing Provider  acetaminophen (TYLENOL) 500 MG tablet Take 1,000 mg by mouth every 6 (six) hours as needed (pain).    [provider]  benzonatate (TESSALON) 100 MG capsule Take 1 capsule (100 mg total) by mouth every 8 (eight) hours. 12/31/18   Luevenia Maxin, Mina A, PA-C  cyclobenzaprine (FLEXERIL) 10 MG tablet Take 1 tablet (10 mg total) by mouth 2 (two) times daily as needed for muscle spasms. 03/15/17   Janne Napoleon, NP  diclofenac (VOLTAREN) 50 MG EC tablet Take 1 tablet (50 mg total) by mouth 2 (two) times daily. 03/15/17  Damian Leavell, Hope M, NP  fluticasone [facility]) 50 MCG/ACT nasal spray Place 2 sprays into both nostrils daily. 12/31/18   Fawze, Mina A, PA-C  HYDROcodone-homatropine (HYCODAN) 5-1.5 MG/5ML syrup Take 5 mLs by mouth every 6 (six) hours as needed for cough. 10/06/16   Arby Barrette, MD  ibuprofen (ADVIL,MOTRIN) 800 MG tablet Take 1 tablet (800 mg total) by mouth 3 (three) times daily. Patient not taking: Reported on 10/06/2016 09/09/16   Rolland Porter, MD  ibuprofen (ADVIL,MOTRIN) 800 MG tablet Take 1 tablet (800 mg total) by mouth 3 (three) times daily. 10/06/16   Arby Barrette, MD  meloxicam (MOBIC) 7.5 MG tablet Take 1 tablet (7.5 mg total) by mouth daily. 07/30/17   Audry Pili, PA-C  methocarbamol (ROBAXIN) 500 MG tablet Take 1 tablet (500 mg total) by mouth 2 (two) times daily. 07/30/17   Audry Pili, PA-C  naproxen (NAPROSYN) 500 MG tablet Take 1 tablet  (500 mg total) by mouth 2 (two) times daily. 05/30/19   Sabas Sous, MD  ondansetron (ZOFRAN ODT) 4 MG disintegrating tablet Take 1 tablet (4 mg total) by mouth every 8 (eight) hours as needed for nausea or vomiting. 12/31/18   Michela Pitcher A, PA-C    Allergies    Penicillins  Review of Systems   Review of Systems  Constitutional: Negative for fever.  HENT: Positive for sore throat.   Gastrointestinal: Negative for nausea and vomiting.  Musculoskeletal: Negative for arthralgias and myalgias.  Skin: Negative for rash and wound.  Allergic/Immunologic: Negative for immunocompromised state.  Neurological: Positive for headaches.  Hematological: Negative for adenopathy.  All other systems reviewed and are negative.   Physical Exam Updated Vital Signs BP 100/70 (BP Location: Left Arm)   Pulse 75   Temp 98.9 F (37.2 C) (Oral)   Resp 18   Ht 5\' 6"  (1.676 m)   Wt 91.6 kg   SpO2 98%   BMI 32.60 kg/m   Physical Exam Vitals and nursing note reviewed.  Constitutional:      General: She is not in acute distress.    Appearance: She is well-developed. She is not diaphoretic.  HENT:     Head: Normocephalic and atraumatic.  Cardiovascular:     Rate and Rhythm: Normal rate and regular rhythm.     Pulses: Normal pulses.     Heart sounds: Normal heart sounds.  Pulmonary:     Effort: Pulmonary effort is normal.     Breath sounds: Normal breath sounds.  Musculoskeletal:     Cervical back: Neck supple.  Lymphadenopathy:     Cervical: No cervical adenopathy.  Skin:    General: Skin is warm and dry.     Findings: No erythema or rash.  Neurological:     Mental Status: She is alert and oriented to person, place, and time.     Gait: Gait normal.  Psychiatric:        Behavior: Behavior normal.     ED Results / Procedures / Treatments   Labs (all labs ordered are listed, but only abnormal results are displayed) Labs Reviewed  SARS CORONAVIRUS 2 (TAT 6-24 HRS)     EKG None  Radiology No results found.  Procedures Procedures (including critical care time)  Medications Ordered in ED Medications - No data to display  ED Course  I have reviewed the triage vital signs and the nursing notes.  Pertinent labs & imaging results that were available during my care of the patient were reviewed by me and  considered in my medical decision making (see chart for details).  Clinical Course as of Aug 03 1415  Thu Aug 02, 2020  8047 34 year old female with complaint of headache and sore throat, concerned she may have Covid after known exposure.  Patient is here with her family for Covid testing.  On exam patient is well-appearing, no tender cervical adenopathy, lungs clear.  Patient was advised to recheck with her PCP, follow-up in her MyChart for her Covid test results.   [LM]    Clinical Course User Index [LM] Alden Hipp   MDM Rules/Calculators/A&P                          Final Clinical Impression(s) / ED Diagnoses Final diagnoses:  Pharyngitis, unspecified etiology    Rx / DC Orders ED Discharge Orders    None       Jeannie Fend, PA-C 08/02/20 1416    Terald Sleeper, MD 08/02/20 1558

## 2020-08-02 NOTE — ED Triage Notes (Signed)
Per pt, states she was at a Labor day cook out-one of her friends informed her that 2 members of her family tested positive for covid-complaining of headache and sore throat

## 2020-08-02 NOTE — Discharge Instructions (Addendum)
Home to quarantine pending your test results. Recommend recheck Covid PCR test in 3 days at local pharmacy, call go online today to schedule this test.  Recheck with your doctor.

## 2022-08-17 ENCOUNTER — Emergency Department (HOSPITAL_COMMUNITY)
Admission: EM | Admit: 2022-08-17 | Discharge: 2022-08-17 | Discharge status: Against Medical Advice | Payer: Medicaid Other | Attending: Emergency Medicine | Admitting: Emergency Medicine

## 2022-08-17 ENCOUNTER — Emergency Department (HOSPITAL_COMMUNITY): Payer: Medicaid Other

## 2022-08-17 ENCOUNTER — Encounter (HOSPITAL_COMMUNITY): Payer: Self-pay

## 2022-08-17 DIAGNOSIS — R0789 Other chest pain: Secondary | ICD-10-CM | POA: Insufficient documentation

## 2022-08-17 DIAGNOSIS — Z5321 Procedure and treatment not carried out due to patient leaving prior to being seen by health care provider: Secondary | ICD-10-CM | POA: Insufficient documentation

## 2022-08-17 LAB — TROPONIN I (HIGH SENSITIVITY): Troponin I (High Sensitivity): <2 ng/L (ref <18)

## 2022-08-17 LAB — CBC
HCT: 40.6 % (ref 36.0–46.0)
Hemoglobin: 13.4 g/dL (ref 12.0–15.0)
MCH: 28.4 pg (ref 26.0–34.0)
MCHC: 33 g/dL (ref 30.0–36.0)
MCV: 86 fL (ref 80.0–100.0)
Platelets: 280 10*3/uL (ref 150–400)
RBC: 4.72 MIL/uL (ref 3.87–5.11)
RDW: 15.1 % (ref 11.5–15.5)
WBC: 13.5 10*3/uL — ABNORMAL HIGH (ref 4.0–10.5)
nRBC: 0 % (ref 0.0–0.2)

## 2022-08-17 LAB — BASIC METABOLIC PANEL
Anion gap: 5 (ref 5–15)
BUN: 13 mg/dL (ref 6–20)
CO2: 26 mmol/L (ref 22–32)
Calcium: 9.4 mg/dL (ref 8.9–10.3)
Chloride: 107 mmol/L (ref 98–111)
Creatinine, Ser: 1.03 mg/dL — ABNORMAL HIGH (ref 0.44–1.00)
GFR, Estimated: >60 mL/min (ref >60)
Glucose, Bld: 91 mg/dL (ref 70–99)
Potassium: 3.7 mmol/L (ref 3.5–5.1)
Sodium: 138 mmol/L (ref 135–145)

## 2022-08-17 LAB — I-STAT BETA HCG BLOOD, ED (MC, WL, AP ONLY): I-stat hCG, quantitative: <5 m[IU]/mL (ref <5)

## 2022-08-17 NOTE — ED Provider Triage Note (Signed)
Emergency Medicine Provider Triage Evaluation Note  [name] , a 36 y.o. female  was evaluated in triage.  Pt complains of chest tightness and increased stress.  On and off over the last week, though today has felt constant.  Reports increased life stressors.  Began feeling palpitations, brought herself to the bathroom and ran cold water over her face to try and calm herself down.  Provided little relief.  Still feels chest tightness.  Denies recent fevers, chills, URI symptoms, or abdominal pain.  No known hx of anxiety or panic attacks.  Review of Systems  Positive:  Negative: See above  Physical Exam  BP 132/79 (BP Location: Right Arm)   Pulse 75   Temp 99.7 F (37.6 C) (Oral)   Resp 18   Ht 5\' 6"  (1.676 m)   Wt 97.5 kg   SpO2 99%   BMI 34.70 kg/m  Gen:   Awake, no distress   Resp:  Normal effort, equal chest rise MSK:   Moves extremities without difficulty  Other:  Abdomen soft, nontender.  Anxious appearing.  Medical Decision Making  Medically screening exam initiated at 1:34 PM.  Appropriate orders placed.  [name] was informed that the remainder of the evaluation will be completed by another provider, this initial triage assessment does not replace that evaluation, and the importance of remaining in the ED until their evaluation is complete.     Prince Rome, PA-C 24/26/83 1339

## 2022-08-17 NOTE — ED Triage Notes (Signed)
Pt arrived via POV, c/o chest tightness, worsening with mvmt.

## 2023-10-23 ENCOUNTER — Emergency Department (HOSPITAL_COMMUNITY)
Admission: EM | Admit: 2023-10-23 | Discharge: 2023-10-23 | Disposition: A | Discharge status: Home / Self Care | Payer: Medicaid Other | Attending: Emergency Medicine | Admitting: Emergency Medicine

## 2023-10-23 ENCOUNTER — Other Ambulatory Visit: Payer: Self-pay

## 2023-10-23 ENCOUNTER — Encounter (HOSPITAL_COMMUNITY): Payer: Self-pay

## 2023-10-23 DIAGNOSIS — I10 Essential (primary) hypertension: Secondary | ICD-10-CM | POA: Insufficient documentation

## 2023-10-23 DIAGNOSIS — R0981 Nasal congestion: Secondary | ICD-10-CM | POA: Diagnosis present

## 2023-10-23 DIAGNOSIS — Z79899 Other long term (current) drug therapy: Secondary | ICD-10-CM | POA: Insufficient documentation

## 2023-10-23 DIAGNOSIS — J0100 Acute maxillary sinusitis, unspecified: Secondary | ICD-10-CM | POA: Insufficient documentation

## 2023-10-23 NOTE — Discharge Instructions (Signed)
You have been seen today for your complaint of nasal congestion. Your discharge medications include Claritin and Flonase.  These are over-the-counter medications.  I recommend a 12-hour Claritin so that you may take this twice per day.  You may instill 1 spray of Flonase in each nostril once daily. Follow up with: Your primary care provider in 1 week for reevaluation Please seek immediate medical care if you develop any of the following symptoms: You have a very bad headache. You cannot stop vomiting. You have very bad pain or swelling around your face or eyes. You have trouble seeing. You feel confused. Your neck is stiff. You have trouble breathing. At this time there does not appear to be the presence of an emergent medical condition, however there is always the potential for conditions to change. Please read and follow the below instructions.  Do not take your medicine if  develop an itchy rash, swelling in your mouth or lips, or difficulty breathing; call 911 and seek immediate emergency medical attention if this occurs.  You may review your lab tests and imaging results in their entirety on your MyChart account.  Please discuss all results of fully with your primary care provider and other specialist at your follow-up visit.  Note: Portions of this text may have been transcribed using voice recognition software. Every effort was made to ensure accuracy; however, inadvertent computerized transcription errors may still be present.

## 2023-10-23 NOTE — ED Provider Notes (Signed)
[place] EMERGENCY DEPARTMENT AT [facility] Provider Note   CSN: [id number] Arrival date & time: 10/23/23  1527     History  Chief Complaint  Patient presents with   Nasal Congestion    [name] is a 37 y.o. female.  Presenting to the ED for evaluation of nasal congestion.  She developed symptoms approximately 6 days ago.  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  She denies any known sick contacts.  She initially had fevers, body aches, congestion, rhinorrhea, sore throat.  Her symptoms have improved, however her nasal congestion has continued to worsen.  She denies any headaches or neck stiffness.  She developed a cough today and states she feels like she notices fluid leaking down the back of her throat.  No chest pain, abdominal pain, shortness of breath.  HPI     Home Medications Prior to Admission medications   Medication Sig Start Date End Date Taking? Authorizing Provider  acetaminophen (TYLENOL) 500 MG tablet Take 1,000 mg by mouth every 6 (six) hours as needed (pain).    [provider]  benzonatate (TESSALON) 100 MG capsule Take 1 capsule (100 mg total) by mouth every 8 (eight) hours. 12/31/18   Luevenia Maxin, Mina A, PA-C  cyclobenzaprine (FLEXERIL) 10 MG tablet Take 1 tablet (10 mg total) by mouth 2 (two) times daily as needed for muscle spasms. 03/15/17   Janne Napoleon, NP  diclofenac (VOLTAREN) 50 MG EC tablet Take 1 tablet (50 mg total) by mouth 2 (two) times daily. 03/15/17   Janne Napoleon, NP  fluticasone (FLONASE) 50 MCG/ACT nasal spray Place 2 sprays into both nostrils daily. 12/31/18   Fawze, Mina A, PA-C  HYDROcodone-homatropine (HYCODAN) 5-1.5 MG/5ML syrup Take 5 mLs by mouth every 6 (six) hours as needed for cough. 10/06/16   Arby Barrette, MD  ibuprofen (ADVIL,MOTRIN) 800 MG tablet Take 1 tablet (800 mg total) by mouth 3 (three) times daily. Patient not taking: Reported on 10/06/2016 09/09/16   Rolland Porter, MD  ibuprofen  (ADVIL,MOTRIN) 800 MG tablet Take 1 tablet (800 mg total) by mouth 3 (three) times daily. 10/06/16   Arby Barrette, MD  meloxicam (MOBIC) 7.5 MG tablet Take 1 tablet (7.5 mg total) by mouth daily. 07/30/17   Audry Pili, PA-C  methocarbamol (ROBAXIN) 500 MG tablet Take 1 tablet (500 mg total) by mouth 2 (two) times daily. 07/30/17   Audry Pili, PA-C  naproxen (NAPROSYN) 500 MG tablet Take 1 tablet (500 mg total) by mouth 2 (two) times daily. 05/30/19   Sabas Sous, MD  ondansetron (ZOFRAN ODT) 4 MG disintegrating tablet Take 1 tablet (4 mg total) by mouth every 8 (eight) hours as needed for nausea or vomiting. 12/31/18   Michela Pitcher A, PA-C      Allergies    Penicillins    Review of Systems   Review of Systems  HENT:  Positive for congestion.   All other systems reviewed and are negative.   Physical Exam Updated Vital Signs BP (!) 121/93 (BP Location: Left Arm)   Pulse 86   Temp 99.1 F (37.3 C) (Oral)   Ht 5\' 6"  (1.676 m)   Wt 97.5 kg   LMP 10/23/2023 (Exact Date)   SpO2 98%   BMI 34.70 kg/m  Physical Exam Vitals and nursing note reviewed.  Constitutional:      General: She is not in acute distress.    Appearance: She is well-developed.  HENT:  Head: Normocephalic and atraumatic.     Ears:     Comments: Bilateral serous effusions    Nose: Congestion and rhinorrhea present.     Comments: No maxillary or frontal sinus TTP Eyes:     Conjunctiva/sclera: Conjunctivae normal.  Cardiovascular:     Rate and Rhythm: Normal rate and regular rhythm.     Heart sounds: No murmur heard. Pulmonary:     Effort: Pulmonary effort is normal. No respiratory distress.     Breath sounds: Normal breath sounds.  Abdominal:     Palpations: Abdomen is soft.     Tenderness: There is no abdominal tenderness.  Musculoskeletal:        General: No swelling.     Cervical back: Neck supple.  Skin:    General: Skin is warm and dry.     Capillary Refill: Capillary refill takes less than 2  seconds.  Neurological:     Mental Status: She is alert.  Psychiatric:        Mood and Affect: Mood normal.     ED Results / Procedures / Treatments   Labs (all labs ordered are listed, but only abnormal results are displayed) Labs Reviewed - No data to display  EKG None  Radiology No results found.  Procedures Procedures    Medications Ordered in ED Medications - No data to display  ED Course/ Medical Decision Making/ A&P                                 Medical Decision Making This patient presents to the ED for concern of nasal congestion, this involves an extensive number of treatment options, and is a complaint that carries with it a high risk of complications and morbidity.  The differential diagnosis includes viral URI, bacterial sinusitis  Additional history obtained from: Nursing notes from this visit.  Afebrile, mildly hypertensive but otherwise hemodynamically stable.  37 year old female presenting for evaluation of nasal congestion.  She had a constellation of symptoms consistent with a viral URI.  Symptoms have improved, however her nasal congestion has persisted.  She appears well on physical exam.  She does have bilateral serous effusions and nasal congestion.  No frontal or maxillary sinus TTP.  Suspect viral etiology of her symptoms.  She was done supportive care including symptomatic treatment.  She was encouraged to follow-up in 1 week for reevaluation of her symptoms.  She was given return precautions.  Stable at discharge.  At this time there does not appear to be any evidence of an acute emergency medical condition and the patient appears stable for discharge with appropriate outpatient follow up. Diagnosis was discussed with patient who verbalizes understanding of care plan and is agreeable to discharge. I have discussed return precautions with patient who verbalizes understanding. Patient encouraged to follow-up with their PCP within 1 week. All questions  answered.  Note: Portions of this report may have been transcribed using voice recognition software. Every effort was made to ensure accuracy; however, inadvertent computerized transcription errors may still be present.        Final Clinical Impression(s) / ED Diagnoses Final diagnoses:  Acute non-recurrent maxillary sinusitis    Rx / DC Orders ED Discharge Orders     None         Michelle Piper, PA-C 10/23/23 1718    Coral Spikes, DO 10/23/23 2247

## 2023-10-23 NOTE — ED Triage Notes (Signed)
Pt complaining of congestion, dry nose, and throat discomfort that's been present since Saturday night. Pt states that she has been feeling better since Saturday except the sinus pressure being unbearable.

## 2024-05-22 ENCOUNTER — Emergency Department (HOSPITAL_COMMUNITY)
Admission: EM | Admit: 2024-05-22 | Discharge: 2024-05-23 | Disposition: A | Discharge status: Home / Self Care | Attending: Emergency Medicine | Admitting: Emergency Medicine

## 2024-05-22 ENCOUNTER — Emergency Department (HOSPITAL_COMMUNITY)

## 2024-05-22 ENCOUNTER — Other Ambulatory Visit: Payer: Self-pay

## 2024-05-22 ENCOUNTER — Encounter (HOSPITAL_COMMUNITY): Payer: Self-pay | Admitting: Emergency Medicine

## 2024-05-22 DIAGNOSIS — S161XXA Strain of muscle, fascia and tendon at neck level, initial encounter: Secondary | ICD-10-CM | POA: Diagnosis not present

## 2024-05-22 DIAGNOSIS — S199XXA Unspecified injury of neck, initial encounter: Secondary | ICD-10-CM | POA: Diagnosis present

## 2024-05-22 DIAGNOSIS — F17200 Nicotine dependence, unspecified, uncomplicated: Secondary | ICD-10-CM | POA: Insufficient documentation

## 2024-05-22 DIAGNOSIS — X58XXXA Exposure to other specified factors, initial encounter: Secondary | ICD-10-CM | POA: Diagnosis not present

## 2024-05-22 DIAGNOSIS — R519 Headache, unspecified: Secondary | ICD-10-CM | POA: Diagnosis not present

## 2024-05-22 MED ORDER — KETOROLAC TROMETHAMINE 30 MG/ML IJ SOLN
15.0000 mg | Freq: Once | INTRAMUSCULAR | Status: AC
  Administered 2024-05-22: 15 mg via INTRAMUSCULAR
  Filled 2024-05-22: qty 1

## 2024-05-22 MED ORDER — DEXAMETHASONE SODIUM PHOSPHATE 10 MG/ML IJ SOLN
10.0000 mg | Freq: Once | INTRAMUSCULAR | Status: AC
  Administered 2024-05-22: 10 mg via INTRAMUSCULAR
  Filled 2024-05-22: qty 1

## 2024-05-22 NOTE — ED Triage Notes (Signed)
 Pt presents with right neck pain and ongoing headache x 3 weeks.  Seen for same recently.  Pt reports she called her doc and was told to not come to the ED, just wait and they would see her Monday or Tuesday.  However pt reports when she got to work tonight she felt she needed to be seen  ALso reports her BP has been uncontrolled.

## 2024-05-22 NOTE — Discharge Instructions (Signed)
 Your CT scan was reassuring this evening.  Your headache seems most consistent at this time with a tension headache.  You may continue your over-the-counter medications for headache management.  Your neck pain seems most consistent with a musculoskeletal pain.  The same medications you are using for the headache should also help with the neck pain.  Follow-up as planned with your primary care provider for further management.  If you develop any life-threatening symptoms return to the emergency department.

## 2024-05-22 NOTE — ED Provider Notes (Signed)
 [place] EMERGENCY DEPARTMENT AT [facility] Provider Note   CSN: [id number] Arrival date & time: 05/22/24  2055     Patient presents with: Neck Pain and Headache   [name] is a 38 y.o. female.  Patient presents the emergency room complaining of right-sided neck pain and headache which been ongoing for approximately 3 weeks.  She states that Excedrin Migraine does manage to help alleviate symptoms temporarily.  She has also been taking ibuprofen .  She endorses occasional pain in the left arm described as a paresthesia type feeling.  The patient works as a Lawyer.  She denies photophobia, difficulty with neck movement, fevers, nausea, vomiting, other systemic symptoms.  Past medical history significant for migraines    Neck Pain Associated symptoms: headaches   Headache Associated symptoms: neck pain        Prior to Admission medications   Medication Sig Start Date End Date Taking? Authorizing Provider  acetaminophen  (TYLENOL ) 500 MG tablet Take 1,000 mg by mouth every 6 (six) hours as needed (pain).    [provider]  benzonatate  (TESSALON ) 100 MG capsule Take 1 capsule (100 mg total) by mouth every 8 (eight) hours. 12/31/18   Fawze, Mina A, PA-C  cyclobenzaprine  (FLEXERIL ) 10 MG tablet Take 1 tablet (10 mg total) by mouth 2 (two) times daily as needed for muscle spasms. 03/15/17   Jamelle Lorrayne HERO, NP  diclofenac  (VOLTAREN ) 50 MG EC tablet Take 1 tablet (50 mg total) by mouth 2 (two) times daily. 03/15/17   Jamelle Lorrayne HERO, NP  fluticasone  (FLONASE ) 50 MCG/ACT nasal spray Place 2 sprays into both nostrils daily. 12/31/18   Fawze, Mina A, PA-C  HYDROcodone -homatropine (HYCODAN) 5-1.5 MG/5ML syrup Take 5 mLs by mouth every 6 (six) hours as needed for cough. 10/06/16   Armenta Canning, MD  ibuprofen  (ADVIL ,MOTRIN ) 800 MG tablet Take 1 tablet (800 mg total) by mouth 3 (three) times daily. Patient not taking: Reported on 10/06/2016 09/09/16   Lynwood Anes, MD  ibuprofen   (ADVIL ,MOTRIN ) 800 MG tablet Take 1 tablet (800 mg total) by mouth 3 (three) times daily. 10/06/16   Armenta Canning, MD  meloxicam  (MOBIC ) 7.5 MG tablet Take 1 tablet (7.5 mg total) by mouth daily. 07/30/17   Olympia Gee, PA-C  methocarbamol  (ROBAXIN ) 500 MG tablet Take 1 tablet (500 mg total) by mouth 2 (two) times daily. 07/30/17   Olympia Gee, PA-C  naproxen  (NAPROSYN ) 500 MG tablet Take 1 tablet (500 mg total) by mouth 2 (two) times daily. 05/30/19   Theadore Ozell HERO, MD  ondansetron  (ZOFRAN  ODT) 4 MG disintegrating tablet Take 1 tablet (4 mg total) by mouth every 8 (eight) hours as needed for nausea or vomiting. 12/31/18   Meryle Ip A, PA-C    Allergies: Penicillins    Review of Systems  Musculoskeletal:  Positive for neck pain.  Neurological:  Positive for headaches.    Updated Vital Signs BP (!) 144/84 (BP Location: Right Arm)   Pulse 78   Temp 98.5 F (36.9 C) (Oral)   Resp 17   SpO2 100%   Physical Exam Vitals reviewed.  HENT:     Head: Normocephalic and atraumatic.   Eyes:     Extraocular Movements: Extraocular movements intact.     Conjunctiva/sclera: Conjunctivae normal.     Pupils: Pupils are equal, round, and reactive to light.   Neck:     Meningeal: Brudzinski's sign absent.  Pulmonary:     Effort: Pulmonary effort is normal. No respiratory  distress.   Musculoskeletal:        General: No signs of injury.     Cervical back: Normal range of motion and neck supple. No rigidity or tenderness.   Skin:    General: Skin is dry.   Neurological:     General: No focal deficit present.     Mental Status: She is alert and oriented to person, place, and time.   Psychiatric:        Speech: Speech normal.        Behavior: Behavior normal.     (all labs ordered are listed, but only abnormal results are displayed) Labs Reviewed - No data to display  EKG: None  Radiology: CT Head Wo Contrast Result Date: 05/22/2024 CLINICAL DATA:  Headache. EXAM: CT HEAD WITHOUT  CONTRAST TECHNIQUE: Contiguous axial images were obtained from the base of the skull through the vertex without intravenous contrast. RADIATION DOSE REDUCTION: This exam was performed according to the departmental dose-optimization program which includes automated exposure control, adjustment of the mA and/or kV according to patient size and/or use of iterative reconstruction technique. COMPARISON:  None Available. FINDINGS: Brain: No evidence of acute infarction, hemorrhage, hydrocephalus, extra-axial collection or mass lesion/mass effect. Vascular: No hyperdense vessel or unexpected calcification. Skull: Normal. Negative for fracture or focal lesion. Sinuses/Orbits: No acute finding. Other: None. IMPRESSION: No acute intracranial pathology. Electronically Signed   By: Suzen Dials M.D.   On: 05/22/2024 23:38     Procedures   Medications Ordered in the ED  ketorolac  (TORADOL ) 30 MG/ML injection 15 mg (15 mg Intramuscular Given 05/22/24 2233)  dexamethasone  (DECADRON ) injection 10 mg (10 mg Intramuscular Given 05/22/24 2234)                                    Medical Decision Making Amount and/or Complexity of Data Reviewed Radiology: ordered.  Risk Prescription drug management.   This patient presents to the ED for concern of headache, this involves an extensive number of treatment options, and is a complaint that carries with it a high risk of complications and morbidity.  The differential diagnosis includes tension headache, migraine headache, cluster headache, other headache disorder, intracranial abnormality, meningitis, others   Co morbidities / Chronic conditions that complicate the patient evaluation  History of headaches   Additional history obtained:  Additional history obtained from EMR External records from outside source obtained and reviewed including primary care notes   Imaging Studies ordered:  I ordered imaging studies including head CT I independently  visualized and interpreted imaging which showed no acute findings I agree with the radiologist interpretation   Problem List / ED Course / Critical interventions / Medication management   I ordered medication including Decadron  and Toradol  Reevaluation of the patient after these medicines showed that the patient improved I have reviewed the patients home medicines and have made adjustments as needed   Social Determinants of Health:  Patient is a daily tobacco smoker   Test / Admission - Considered:  Patient with headache most consistent with a tension headache.  Headache is across the front of the patient's head and described as some tension behind bilateral eyes.  She has no nuchal rigidity, negative Brudzinski sign, no fever, no photophobia, no signs of meningitis at this time.  CT head was negative for any intracranial abnormalities.  Her neck has tenderness on the right side.  Primary care suspected a musculoskeletal  cause which seems consistent with her presentation this evening.  I do not believe she needs emergent spinal imaging.  The patient does complain of some intermittent paresthesias in the left arm which seem most consistent with a cervical radiculopathy.  Patient planning to follow-up with primary care on Monday or Tuesday of this week.  I have recommended symptomatic management with over-the-counter medication at home.  Return precautions provided.      Final diagnoses:  Bad headache  Strain of neck muscle, initial encounter    ED Discharge Orders     None          Logan Ubaldo KATHEE DEVONNA 05/22/24 2355    Jerral Meth, MD 05/23/24 [phone]

## 2024-05-24 ENCOUNTER — Other Ambulatory Visit: Payer: Self-pay | Admitting: Physician Assistant

## 2024-05-24 ENCOUNTER — Ambulatory Visit
Admission: RE | Admit: 2024-05-24 | Discharge: 2024-05-24 | Disposition: A | Discharge status: Home / Self Care | Source: Ambulatory Visit | Attending: Physician Assistant | Admitting: Physician Assistant

## 2024-05-24 DIAGNOSIS — M542 Cervicalgia: Secondary | ICD-10-CM

## 2024-05-25 ENCOUNTER — Other Ambulatory Visit: Payer: Self-pay | Admitting: Physician Assistant

## 2024-05-25 DIAGNOSIS — R7989 Other specified abnormal findings of blood chemistry: Secondary | ICD-10-CM

## 2024-05-26 ENCOUNTER — Other Ambulatory Visit: Payer: Self-pay | Admitting: Physician Assistant

## 2024-05-26 ENCOUNTER — Encounter: Payer: Self-pay | Admitting: Physician Assistant

## 2024-05-26 DIAGNOSIS — M5412 Radiculopathy, cervical region: Secondary | ICD-10-CM

## 2024-06-05 ENCOUNTER — Other Ambulatory Visit

## 2024-06-17 ENCOUNTER — Ambulatory Visit
Admission: RE | Admit: 2024-06-17 | Discharge: 2024-06-17 | Disposition: A | Discharge status: Home / Self Care | Source: Ambulatory Visit | Attending: Physician Assistant | Admitting: Physician Assistant

## 2024-06-17 DIAGNOSIS — R7989 Other specified abnormal findings of blood chemistry: Secondary | ICD-10-CM

## 2024-07-08 ENCOUNTER — Inpatient Hospital Stay: Admission: RE | Admit: 2024-07-08 | Source: Ambulatory Visit

## 2024-07-15 ENCOUNTER — Ambulatory Visit
Admission: RE | Admit: 2024-07-15 | Discharge: 2024-07-15 | Disposition: A | Discharge status: Home / Self Care | Source: Ambulatory Visit | Attending: Physician Assistant | Admitting: Physician Assistant

## 2024-07-15 DIAGNOSIS — M5412 Radiculopathy, cervical region: Secondary | ICD-10-CM

## 2024-08-19 ENCOUNTER — Emergency Department (HOSPITAL_COMMUNITY)
Admission: EM | Admit: 2024-08-19 | Discharge: 2024-08-19 | Disposition: A | Discharge status: Home / Self Care | Attending: Emergency Medicine | Admitting: Emergency Medicine

## 2024-08-19 ENCOUNTER — Other Ambulatory Visit: Payer: Self-pay

## 2024-08-19 DIAGNOSIS — J069 Acute upper respiratory infection, unspecified: Secondary | ICD-10-CM | POA: Diagnosis not present

## 2024-08-19 DIAGNOSIS — R059 Cough, unspecified: Secondary | ICD-10-CM | POA: Diagnosis present

## 2024-08-19 LAB — COMPREHENSIVE METABOLIC PANEL WITH GFR
ALT: 42 U/L (ref 0–44)
AST: 31 U/L (ref 15–41)
Albumin: 4.1 g/dL (ref 3.5–5.0)
Alkaline Phosphatase: 96 U/L (ref 38–126)
Anion gap: 12 (ref 5–15)
BUN: 9 mg/dL (ref 6–20)
CO2: 22 mmol/L (ref 22–32)
Calcium: 9.2 mg/dL (ref 8.9–10.3)
Chloride: 106 mmol/L (ref 98–111)
Creatinine, Ser: 1.23 mg/dL — ABNORMAL HIGH (ref 0.44–1.00)
GFR, Estimated: 57 mL/min — ABNORMAL LOW (ref >60)
Glucose, Bld: 94 mg/dL (ref 70–99)
Potassium: 3.6 mmol/L (ref 3.5–5.1)
Sodium: 140 mmol/L (ref 135–145)
Total Bilirubin: 0.5 mg/dL (ref 0.0–1.2)
Total Protein: 7.7 g/dL (ref 6.5–8.1)

## 2024-08-19 LAB — CBC WITH DIFFERENTIAL/PLATELET
Abs Immature Granulocytes: 0.01 K/uL (ref 0.00–0.07)
Basophils Absolute: 0.1 K/uL (ref 0.0–0.1)
Basophils Relative: 1 %
Eosinophils Absolute: 0.4 K/uL (ref 0.0–0.5)
Eosinophils Relative: 4 %
HCT: 42.1 % (ref 36.0–46.0)
Hemoglobin: 13.2 g/dL (ref 12.0–15.0)
Immature Granulocytes: 0 %
Lymphocytes Relative: 16 %
Lymphs Abs: 1.4 K/uL (ref 0.7–4.0)
MCH: 26.5 pg (ref 26.0–34.0)
MCHC: 31.4 g/dL (ref 30.0–36.0)
MCV: 84.5 fL (ref 80.0–100.0)
Monocytes Absolute: 1 K/uL (ref 0.1–1.0)
Monocytes Relative: 11 %
Neutro Abs: 6.1 K/uL (ref 1.7–7.7)
Neutrophils Relative %: 68 %
Platelets: 316 K/uL (ref 150–400)
RBC: 4.98 MIL/uL (ref 3.87–5.11)
RDW: 16.1 % — ABNORMAL HIGH (ref 11.5–15.5)
WBC: 8.9 K/uL (ref 4.0–10.5)
nRBC: 0 % (ref 0.0–0.2)

## 2024-08-19 MED ORDER — HYDROCOD POLI-CHLORPHE POLI ER 10-8 MG/5ML PO SUER
5.0000 mL | Freq: Once | ORAL | Status: AC
  Administered 2024-08-19: 5 mL via ORAL
  Filled 2024-08-19: qty 5

## 2024-08-19 MED ORDER — HYDROCODONE BIT-HOMATROP MBR 5-1.5 MG/5ML PO SOLN: 5.0000 mL | Freq: Four times a day (QID) | ORAL | 0 refills | Status: AC | PRN

## 2024-08-19 MED ORDER — ACETAMINOPHEN 500 MG PO TABS
1000.0000 mg | ORAL_TABLET | Freq: Once | ORAL | Status: AC
  Administered 2024-08-19: 1000 mg via ORAL
  Filled 2024-08-19: qty 2

## 2024-08-19 MED ORDER — BENZONATATE 100 MG PO CAPS: 100.0000 mg | ORAL_CAPSULE | Freq: Three times a day (TID) | ORAL | 0 refills | Status: AC

## 2024-08-19 NOTE — Discharge Instructions (Addendum)
 Likely have a viral upper respiratory infection.  Please drink plenty of fluids.  Can also drink Gatorade 0 or other electrolyte drinks with no problems.  I have sent cough syrup which can help suppress cough.  Have also sent Tessalon  Perles which she can use as needed.  Your blood work was collected before we spoke.  We discussed going home before it results or waiting for it.  You are okay going home before he resulted.  If you have mitral you can follow-up with the results there.  Return for any emergent symptoms.  Take Tylenol  1000 mg every 6 hours to help with fever and bodyaches.

## 2024-08-19 NOTE — ED Provider Notes (Addendum)
 [place] EMERGENCY DEPARTMENT AT [facility] Provider Note   CSN: [id number] Arrival date & time: 08/19/24  2145     Patient presents with: Flu like symptoms   [name] is a 38 y.o. female.   38 year old female presents today for concern of flulike symptoms that have been ongoing now for 2 days.  She has been taken TheraFlu and Tylenol .  She states cough is her most concerning symptoms.  She is having significant coughing spells that are disruptive to her daily activities.  She states the coughing spells are causing her to spit up.  She has an emesis bag with her filled with some clear fluid.  Initially at 60.  This was emesis however she clarifies that this was spit up after the coughing spells.  She has no shortness of breath or chest pain.  Denies any known fever at home.  No other complaints at this time.  The history is provided by the patient. No language interpreter was used.       Prior to Admission medications   Medication Sig Start Date End Date Taking? Authorizing Provider  benzonatate  (TESSALON ) 100 MG capsule Take 1 capsule (100 mg total) by mouth every 8 (eight) hours. 08/19/24  Yes Yves Fodor, PA-C  HYDROcodone  bit-homatropine (HYCODAN) 5-1.5 MG/5ML syrup Take 5 mLs by mouth every 6 (six) hours as needed for cough. 08/19/24  Yes Jann Ra, PA-C  acetaminophen  (TYLENOL ) 500 MG tablet Take 1,000 mg by mouth every 6 (six) hours as needed (pain).    [provider]  cyclobenzaprine  (FLEXERIL ) 10 MG tablet Take 1 tablet (10 mg total) by mouth 2 (two) times daily as needed for muscle spasms. 03/15/17   Jamelle Lorrayne HERO, NP  diclofenac  (VOLTAREN ) 50 MG EC tablet Take 1 tablet (50 mg total) by mouth 2 (two) times daily. 03/15/17   Jamelle Lorrayne HERO, NP  fluticasone  (FLONASE ) 50 MCG/ACT nasal spray Place 2 sprays into both nostrils daily. 12/31/18   Fawze, Mina A, PA-C  HYDROcodone -homatropine (HYCODAN) 5-1.5 MG/5ML syrup Take 5 mLs by mouth every 6 (six) hours  as needed for cough. 10/06/16   Armenta Canning, MD  ibuprofen  (ADVIL ,MOTRIN ) 800 MG tablet Take 1 tablet (800 mg total) by mouth 3 (three) times daily. Patient not taking: Reported on 10/06/2016 09/09/16   Lynwood Anes, MD  ibuprofen  (ADVIL ,MOTRIN ) 800 MG tablet Take 1 tablet (800 mg total) by mouth 3 (three) times daily. 10/06/16   Armenta Canning, MD  meloxicam  (MOBIC ) 7.5 MG tablet Take 1 tablet (7.5 mg total) by mouth daily. 07/30/17   Olympia Gee, PA-C  methocarbamol  (ROBAXIN ) 500 MG tablet Take 1 tablet (500 mg total) by mouth 2 (two) times daily. 07/30/17   Olympia Gee, PA-C  naproxen  (NAPROSYN ) 500 MG tablet Take 1 tablet (500 mg total) by mouth 2 (two) times daily. 05/30/19   Theadore Ozell HERO, MD  ondansetron  (ZOFRAN  ODT) 4 MG disintegrating tablet Take 1 tablet (4 mg total) by mouth every 8 (eight) hours as needed for nausea or vomiting. 12/31/18   Fawze, Mina A, PA-C    Allergies: Penicillins    Review of Systems  Constitutional:  Negative for activity change, chills and fever.  HENT:  Positive for congestion, sinus pressure and sore throat. Negative for trouble swallowing and voice change.   Eyes:  Negative for visual disturbance.  Respiratory:  Positive for cough. Negative for shortness of breath.   Cardiovascular:  Negative for chest pain.  Gastrointestinal:  Negative for abdominal  pain, nausea and vomiting.  Neurological:  Negative for weakness and light-headedness.  All other systems reviewed and are negative.   Updated Vital Signs BP (!) 142/79   Pulse 91   Temp 99.3 F (37.4 C) (Oral)   Resp 16   SpO2 98%   Physical Exam Vitals and nursing note reviewed.  Constitutional:      General: She is not in acute distress.    Appearance: Normal appearance. She is not ill-appearing.  HENT:     Head: Normocephalic and atraumatic.     Nose: Nose normal.     Mouth/Throat:     Mouth: Mucous membranes are moist.     Pharynx: No oropharyngeal exudate or posterior oropharyngeal  erythema.  Eyes:     Conjunctiva/sclera: Conjunctivae normal.  Cardiovascular:     Rate and Rhythm: Normal rate and regular rhythm.  Pulmonary:     Effort: Pulmonary effort is normal. No respiratory distress.  Musculoskeletal:        General: No deformity.  Skin:    Findings: No rash.  Neurological:     Mental Status: She is alert.     (all labs ordered are listed, but only abnormal results are displayed) Labs Reviewed  CBC WITH DIFFERENTIAL/PLATELET - Abnormal; Notable for the following components:      Result Value   RDW 16.1 (*)    All other components within normal limits  RESP PANEL BY RT-PCR (RSV, FLU A&B, COVID)  [id number]  GROUP A STREP BY PCR  COMPREHENSIVE METABOLIC PANEL WITH GFR    EKG: None  Radiology: No results found.   Procedures   Medications Ordered in the ED  acetaminophen  (TYLENOL ) tablet 1,000 mg (has no administration in time range)  chlorpheniramine-HYDROcodone  (TUSSIONEX) 10-8 MG/5ML suspension 5 mL (has no administration in time range)                                    Medical Decision Making Amount and/or Complexity of Data Reviewed Labs: ordered.  Risk OTC drugs. Prescription drug management.   38 year old female presents today for concern of flulike symptoms.  Ongoing for 2 days. Patient appears uncomfortable but not in distress and does not appear ill.  Hemodynamically patient is stable.  She states she does have kidney disease and missed her appointment with her nephrologist today.  She has been told not to take ibuprofen . Blood work was collected prior to our interview as well as strep test and respiratory panel.  After my discussion with the patient we discussed management.  She does not want to wait around for blood work.  I discussed that she can follow-up with the results on MyChart.  Return precautions given.  Hycodan and Tessalon  Perles prescribed.  Supportive care discussed.  Discussed follow-up with PCP. Patient voices  understanding and is in agreement with plan.  Final diagnoses:  Viral URI with cough    ED Discharge Orders          Ordered    HYDROcodone  bit-homatropine (HYCODAN) 5-1.5 MG/5ML syrup  Every 6 hours PRN        08/19/24 2236    benzonatate  (TESSALON ) 100 MG capsule  Every 8 hours        08/19/24 2236               Hildegard Loge, PA-C 08/19/24 2254    Hildegard Loge, PA-C 08/19/24 2255    Dasie Faden,  MD 08/23/24 [phone]

## 2024-08-19 NOTE — ED Triage Notes (Addendum)
 Patient c/o flu like symptoms x 2 days. Patient report worsening cough and sore throat. Patient denies SOB/CP. Patient denies fever at home.

## 2024-08-20 LAB — GROUP A STREP BY PCR: Group A Strep by PCR: NOT DETECTED

## 2024-08-20 LAB — RESP PANEL BY RT-PCR (RSV, FLU A&B, COVID)  RVPGX2
Influenza A by PCR: NEGATIVE
Influenza B by PCR: NEGATIVE
Resp Syncytial Virus by PCR: NEGATIVE
SARS Coronavirus 2 by RT PCR: NEGATIVE
